# Patient Record
Sex: Female | Born: 1935 | Race: White | Hispanic: No | Marital: Single | Smoking: Never smoker
Health system: Southern US, Community
[De-identification: ages and names within clinical notes are randomized; demographics above are authoritative.]

## PROBLEM LIST (undated history)

## (undated) DIAGNOSIS — E119 Type 2 diabetes mellitus without complications: Secondary | ICD-10-CM

## (undated) DIAGNOSIS — I639 Cerebral infarction, unspecified: Secondary | ICD-10-CM

## (undated) HISTORY — PX: HYSTERECTOMY ABDOMINAL WITH SALPINGECTOMY: SHX6725

---

## 2019-07-31 ENCOUNTER — Encounter (HOSPITAL_COMMUNITY): Payer: Self-pay | Admitting: Emergency Medicine

## 2019-07-31 ENCOUNTER — Other Ambulatory Visit: Payer: Self-pay

## 2019-07-31 ENCOUNTER — Emergency Department (HOSPITAL_COMMUNITY): Payer: Medicare Other | Admitting: Registered Nurse

## 2019-07-31 ENCOUNTER — Encounter (HOSPITAL_COMMUNITY)
Admission: EM | Disposition: A | Discharge status: Skilled Nursing Facility | Payer: Self-pay | Source: Home / Self Care | Attending: Neurology

## 2019-07-31 ENCOUNTER — Emergency Department (HOSPITAL_COMMUNITY): Payer: Medicare Other

## 2019-07-31 ENCOUNTER — Inpatient Hospital Stay (HOSPITAL_COMMUNITY)
Admission: EM | Admit: 2019-07-31 | Discharge: 2019-08-09 | DRG: 023 | Disposition: A | Discharge status: Skilled Nursing Facility | Payer: Medicare Other | Attending: Neurology | Admitting: Neurology

## 2019-07-31 DIAGNOSIS — Z87448 Personal history of other diseases of urinary system: Secondary | ICD-10-CM

## 2019-07-31 DIAGNOSIS — Z20828 Contact with and (suspected) exposure to other viral communicable diseases: Secondary | ICD-10-CM | POA: Diagnosis present

## 2019-07-31 DIAGNOSIS — R52 Pain, unspecified: Secondary | ICD-10-CM

## 2019-07-31 DIAGNOSIS — I616 Nontraumatic intracerebral hemorrhage, multiple localized: Secondary | ICD-10-CM | POA: Diagnosis not present

## 2019-07-31 DIAGNOSIS — R414 Neurologic neglect syndrome: Secondary | ICD-10-CM | POA: Diagnosis present

## 2019-07-31 DIAGNOSIS — J309 Allergic rhinitis, unspecified: Secondary | ICD-10-CM | POA: Diagnosis present

## 2019-07-31 DIAGNOSIS — M1611 Unilateral primary osteoarthritis, right hip: Secondary | ICD-10-CM | POA: Diagnosis present

## 2019-07-31 DIAGNOSIS — I6601 Occlusion and stenosis of right middle cerebral artery: Secondary | ICD-10-CM | POA: Diagnosis present

## 2019-07-31 DIAGNOSIS — D72829 Elevated white blood cell count, unspecified: Secondary | ICD-10-CM | POA: Diagnosis not present

## 2019-07-31 DIAGNOSIS — I6523 Occlusion and stenosis of bilateral carotid arteries: Secondary | ICD-10-CM | POA: Diagnosis present

## 2019-07-31 DIAGNOSIS — E785 Hyperlipidemia, unspecified: Secondary | ICD-10-CM | POA: Diagnosis present

## 2019-07-31 DIAGNOSIS — I611 Nontraumatic intracerebral hemorrhage in hemisphere, cortical: Secondary | ICD-10-CM | POA: Diagnosis present

## 2019-07-31 DIAGNOSIS — I10 Essential (primary) hypertension: Secondary | ICD-10-CM | POA: Diagnosis present

## 2019-07-31 DIAGNOSIS — I4821 Permanent atrial fibrillation: Secondary | ICD-10-CM | POA: Diagnosis not present

## 2019-07-31 DIAGNOSIS — M25551 Pain in right hip: Secondary | ICD-10-CM

## 2019-07-31 DIAGNOSIS — K5900 Constipation, unspecified: Secondary | ICD-10-CM | POA: Diagnosis not present

## 2019-07-31 DIAGNOSIS — E1142 Type 2 diabetes mellitus with diabetic polyneuropathy: Secondary | ICD-10-CM | POA: Diagnosis present

## 2019-07-31 DIAGNOSIS — R471 Dysarthria and anarthria: Secondary | ICD-10-CM | POA: Diagnosis present

## 2019-07-31 DIAGNOSIS — R29810 Facial weakness: Secondary | ICD-10-CM | POA: Diagnosis present

## 2019-07-31 DIAGNOSIS — I700 Atherosclerosis of aorta: Secondary | ICD-10-CM | POA: Diagnosis present

## 2019-07-31 DIAGNOSIS — I639 Cerebral infarction, unspecified: Secondary | ICD-10-CM | POA: Insufficient documentation

## 2019-07-31 DIAGNOSIS — R29719 NIHSS score 19: Secondary | ICD-10-CM | POA: Diagnosis present

## 2019-07-31 DIAGNOSIS — H53462 Homonymous bilateral field defects, left side: Secondary | ICD-10-CM | POA: Diagnosis present

## 2019-07-31 DIAGNOSIS — R509 Fever, unspecified: Secondary | ICD-10-CM | POA: Diagnosis not present

## 2019-07-31 DIAGNOSIS — I480 Paroxysmal atrial fibrillation: Secondary | ICD-10-CM | POA: Diagnosis present

## 2019-07-31 DIAGNOSIS — Z7982 Long term (current) use of aspirin: Secondary | ICD-10-CM

## 2019-07-31 DIAGNOSIS — R4189 Other symptoms and signs involving cognitive functions and awareness: Secondary | ICD-10-CM | POA: Diagnosis present

## 2019-07-31 DIAGNOSIS — E1165 Type 2 diabetes mellitus with hyperglycemia: Secondary | ICD-10-CM | POA: Diagnosis present

## 2019-07-31 DIAGNOSIS — I69391 Dysphagia following cerebral infarction: Secondary | ICD-10-CM

## 2019-07-31 DIAGNOSIS — R531 Weakness: Secondary | ICD-10-CM

## 2019-07-31 DIAGNOSIS — E7800 Pure hypercholesterolemia, unspecified: Secondary | ICD-10-CM | POA: Diagnosis not present

## 2019-07-31 DIAGNOSIS — H401130 Primary open-angle glaucoma, bilateral, stage unspecified: Secondary | ICD-10-CM | POA: Diagnosis present

## 2019-07-31 DIAGNOSIS — R338 Other retention of urine: Secondary | ICD-10-CM | POA: Diagnosis not present

## 2019-07-31 DIAGNOSIS — I4891 Unspecified atrial fibrillation: Secondary | ICD-10-CM | POA: Diagnosis not present

## 2019-07-31 DIAGNOSIS — D62 Acute posthemorrhagic anemia: Secondary | ICD-10-CM | POA: Diagnosis not present

## 2019-07-31 DIAGNOSIS — Z8673 Personal history of transient ischemic attack (TIA), and cerebral infarction without residual deficits: Secondary | ICD-10-CM | POA: Diagnosis not present

## 2019-07-31 DIAGNOSIS — R1310 Dysphagia, unspecified: Secondary | ICD-10-CM | POA: Diagnosis present

## 2019-07-31 DIAGNOSIS — I63411 Cerebral infarction due to embolism of right middle cerebral artery: Principal | ICD-10-CM | POA: Diagnosis present

## 2019-07-31 DIAGNOSIS — Z823 Family history of stroke: Secondary | ICD-10-CM

## 2019-07-31 DIAGNOSIS — E119 Type 2 diabetes mellitus without complications: Secondary | ICD-10-CM

## 2019-07-31 DIAGNOSIS — Z880 Allergy status to penicillin: Secondary | ICD-10-CM

## 2019-07-31 DIAGNOSIS — G8194 Hemiplegia, unspecified affecting left nondominant side: Secondary | ICD-10-CM | POA: Diagnosis present

## 2019-07-31 DIAGNOSIS — M79604 Pain in right leg: Secondary | ICD-10-CM | POA: Diagnosis present

## 2019-07-31 DIAGNOSIS — R339 Retention of urine, unspecified: Secondary | ICD-10-CM | POA: Diagnosis not present

## 2019-07-31 DIAGNOSIS — I6389 Other cerebral infarction: Secondary | ICD-10-CM | POA: Diagnosis not present

## 2019-07-31 DIAGNOSIS — Z833 Family history of diabetes mellitus: Secondary | ICD-10-CM

## 2019-07-31 DIAGNOSIS — Z7902 Long term (current) use of antithrombotics/antiplatelets: Secondary | ICD-10-CM

## 2019-07-31 HISTORY — PX: RADIOLOGY WITH ANESTHESIA: SHX6223

## 2019-07-31 HISTORY — DX: Type 2 diabetes mellitus without complications: E11.9

## 2019-07-31 HISTORY — DX: Cerebral infarction, unspecified: I63.9

## 2019-07-31 LAB — CBC
HCT: 39.3 % (ref 36.0–46.0)
Hemoglobin: 12.6 g/dL (ref 12.0–15.0)
MCH: 30.2 pg (ref 26.0–34.0)
MCHC: 32.1 g/dL (ref 30.0–36.0)
MCV: 94.2 fL (ref 80.0–100.0)
Platelets: 229 10*3/uL (ref 150–400)
RBC: 4.17 MIL/uL (ref 3.87–5.11)
RDW: 12.4 % (ref 11.5–15.5)
WBC: 8.5 10*3/uL (ref 4.0–10.5)
nRBC: 0 % (ref 0.0–0.2)

## 2019-07-31 LAB — COMPREHENSIVE METABOLIC PANEL
ALT: 15 U/L (ref 0–44)
AST: 19 U/L (ref 15–41)
Albumin: 3.7 g/dL (ref 3.5–5.0)
Alkaline Phosphatase: 61 U/L (ref 38–126)
Anion gap: 10 (ref 5–15)
BUN: 24 mg/dL — ABNORMAL HIGH (ref 8–23)
CO2: 21 mmol/L — ABNORMAL LOW (ref 22–32)
Calcium: 8.9 mg/dL (ref 8.9–10.3)
Chloride: 103 mmol/L (ref 98–111)
Creatinine, Ser: 1.03 mg/dL — ABNORMAL HIGH (ref 0.44–1.00)
GFR calc Af Amer: 55 mL/min — ABNORMAL LOW (ref >60)
GFR calc non Af Amer: 47 mL/min — ABNORMAL LOW (ref >60)
Glucose, Bld: 292 mg/dL — ABNORMAL HIGH (ref 70–99)
Potassium: 4.4 mmol/L (ref 3.5–5.1)
Sodium: 134 mmol/L — ABNORMAL LOW (ref 135–145)
Total Bilirubin: 1.3 mg/dL — ABNORMAL HIGH (ref 0.3–1.2)
Total Protein: 6.6 g/dL (ref 6.5–8.1)

## 2019-07-31 LAB — PROTIME-INR
INR: 1 (ref 0.8–1.2)
Prothrombin Time: 13.1 seconds (ref 11.4–15.2)

## 2019-07-31 LAB — I-STAT CHEM 8, ED
BUN: 23 mg/dL (ref 8–23)
Calcium, Ion: 1.11 mmol/L — ABNORMAL LOW (ref 1.15–1.40)
Chloride: 104 mmol/L (ref 98–111)
Creatinine, Ser: 0.9 mg/dL (ref 0.44–1.00)
Glucose, Bld: 282 mg/dL — ABNORMAL HIGH (ref 70–99)
HCT: 39 % (ref 36.0–46.0)
Hemoglobin: 13.3 g/dL (ref 12.0–15.0)
Potassium: 4.4 mmol/L (ref 3.5–5.1)
Sodium: 137 mmol/L (ref 135–145)
TCO2: 23 mmol/L (ref 22–32)

## 2019-07-31 LAB — URINALYSIS, ROUTINE W REFLEX MICROSCOPIC
Bacteria, UA: NONE SEEN
Bilirubin Urine: NEGATIVE
Glucose, UA: >=500 mg/dL — AB
Hgb urine dipstick: NEGATIVE
Ketones, ur: 20 mg/dL — AB
Leukocytes,Ua: NEGATIVE
Nitrite: NEGATIVE
Protein, ur: NEGATIVE mg/dL
Specific Gravity, Urine: 1.024 (ref 1.005–1.030)
pH: 6 (ref 5.0–8.0)

## 2019-07-31 LAB — DIFFERENTIAL
Abs Immature Granulocytes: 0.02 10*3/uL (ref 0.00–0.07)
Basophils Absolute: 0 10*3/uL (ref 0.0–0.1)
Basophils Relative: 0 %
Eosinophils Absolute: 0 10*3/uL (ref 0.0–0.5)
Eosinophils Relative: 0 %
Immature Granulocytes: 0 %
Lymphocytes Relative: 6 %
Lymphs Abs: 0.5 10*3/uL — ABNORMAL LOW (ref 0.7–4.0)
Monocytes Absolute: 0.2 10*3/uL (ref 0.1–1.0)
Monocytes Relative: 3 %
Neutro Abs: 7.8 10*3/uL — ABNORMAL HIGH (ref 1.7–7.7)
Neutrophils Relative %: 91 %

## 2019-07-31 LAB — RAPID URINE DRUG SCREEN, HOSP PERFORMED
Amphetamines: NOT DETECTED
Barbiturates: NOT DETECTED
Benzodiazepines: NOT DETECTED
Cocaine: NOT DETECTED
Opiates: NOT DETECTED
Tetrahydrocannabinol: NOT DETECTED

## 2019-07-31 LAB — APTT: aPTT: 25 seconds (ref 24–36)

## 2019-07-31 LAB — RESPIRATORY PANEL BY RT PCR (FLU A&B, COVID)
Influenza A by PCR: NEGATIVE
Influenza B by PCR: NEGATIVE
SARS Coronavirus 2 by RT PCR: NEGATIVE

## 2019-07-31 LAB — CBG MONITORING, ED: Glucose-Capillary: 268 mg/dL — ABNORMAL HIGH (ref 70–99)

## 2019-07-31 SURGERY — IR WITH ANESTHESIA
Anesthesia: General

## 2019-07-31 MED ORDER — ATORVASTATIN CALCIUM 10 MG PO TABS
20.0000 mg | ORAL_TABLET | Freq: Every day | ORAL | Status: DC
  Administered 2019-08-01 – 2019-08-09 (×8): 20 mg via ORAL
  Filled 2019-07-31 (×8): qty 2

## 2019-07-31 MED ORDER — EPHEDRINE SULFATE-NACL 50-0.9 MG/10ML-% IV SOSY
PREFILLED_SYRINGE | INTRAVENOUS | Status: DC | PRN
  Administered 2019-07-31: 2.5 mg via INTRAVENOUS

## 2019-07-31 MED ORDER — LATANOPROST 0.005 % OP SOLN
1.0000 [drp] | Freq: Every day | OPHTHALMIC | Status: DC
  Administered 2019-08-01 – 2019-08-08 (×9): 1 [drp] via OPHTHALMIC
  Filled 2019-07-31: qty 2.5

## 2019-07-31 MED ORDER — FENTANYL CITRATE (PF) 250 MCG/5ML IJ SOLN
INTRAMUSCULAR | Status: DC | PRN
  Administered 2019-07-31 (×2): 25 ug via INTRAVENOUS

## 2019-07-31 MED ORDER — ROCURONIUM BROMIDE 10 MG/ML (PF) SYRINGE
PREFILLED_SYRINGE | INTRAVENOUS | Status: DC | PRN
  Administered 2019-07-31: 40 mg via INTRAVENOUS

## 2019-07-31 MED ORDER — FENTANYL CITRATE (PF) 100 MCG/2ML IJ SOLN
INTRAMUSCULAR | Status: AC
  Filled 2019-07-31: qty 2

## 2019-07-31 MED ORDER — PHENYLEPHRINE 40 MCG/ML (10ML) SYRINGE FOR IV PUSH (FOR BLOOD PRESSURE SUPPORT)
PREFILLED_SYRINGE | INTRAVENOUS | Status: DC | PRN
  Administered 2019-07-31 (×3): 80 ug via INTRAVENOUS
  Administered 2019-07-31: 40 ug via INTRAVENOUS

## 2019-07-31 MED ORDER — SUCCINYLCHOLINE CHLORIDE 200 MG/10ML IV SOSY
PREFILLED_SYRINGE | INTRAVENOUS | Status: DC | PRN
  Administered 2019-07-31: 90 mg via INTRAVENOUS

## 2019-07-31 MED ORDER — SENNOSIDES-DOCUSATE SODIUM 8.6-50 MG PO TABS
1.0000 | ORAL_TABLET | Freq: Every evening | ORAL | Status: DC | PRN
  Administered 2019-08-03: 1 via ORAL
  Filled 2019-07-31: qty 1

## 2019-07-31 MED ORDER — ACETAMINOPHEN 325 MG PO TABS: 650.0000 mg | ORAL_TABLET | ORAL | Status: DC | PRN

## 2019-07-31 MED ORDER — LACTATED RINGERS IV SOLN: INTRAVENOUS | Status: DC | PRN

## 2019-07-31 MED ORDER — ACETAMINOPHEN 650 MG RE SUPP: 650.0000 mg | RECTAL | Status: DC | PRN

## 2019-07-31 MED ORDER — INSULIN ASPART 100 UNIT/ML ~~LOC~~ SOLN
0.0000 [IU] | SUBCUTANEOUS | Status: DC
  Administered 2019-08-01: 3 [IU] via SUBCUTANEOUS
  Administered 2019-08-01: 2 [IU] via SUBCUTANEOUS
  Administered 2019-08-01: 1 [IU] via SUBCUTANEOUS
  Administered 2019-08-01: 5 [IU] via SUBCUTANEOUS
  Administered 2019-08-02: 3 [IU] via SUBCUTANEOUS
  Administered 2019-08-02: 1 [IU] via SUBCUTANEOUS
  Administered 2019-08-02: 2 [IU] via SUBCUTANEOUS
  Administered 2019-08-02: 1 [IU] via SUBCUTANEOUS
  Administered 2019-08-02: 3 [IU] via SUBCUTANEOUS
  Administered 2019-08-03: 2 [IU] via SUBCUTANEOUS
  Administered 2019-08-03: 5 [IU] via SUBCUTANEOUS
  Administered 2019-08-03 (×2): 2 [IU] via SUBCUTANEOUS
  Administered 2019-08-04: 1 [IU] via SUBCUTANEOUS

## 2019-07-31 MED ORDER — CEFAZOLIN SODIUM-DEXTROSE 2-4 GM/100ML-% IV SOLN
INTRAVENOUS | Status: AC
  Filled 2019-07-31: qty 100

## 2019-07-31 MED ORDER — NITROGLYCERIN 1 MG/10 ML FOR IR/CATH LAB
INTRA_ARTERIAL | Status: AC | PRN
  Administered 2019-07-31 (×2): 25 ug via INTRA_ARTERIAL

## 2019-07-31 MED ORDER — PROPOFOL 10 MG/ML IV BOLUS
INTRAVENOUS | Status: DC | PRN
  Administered 2019-07-31: 100 mg via INTRAVENOUS

## 2019-07-31 MED ORDER — AMIODARONE HCL 200 MG PO TABS
200.0000 mg | ORAL_TABLET | Freq: Every day | ORAL | Status: DC
  Administered 2019-08-01 – 2019-08-09 (×9): 200 mg via ORAL
  Filled 2019-07-31 (×9): qty 1

## 2019-07-31 MED ORDER — SODIUM CHLORIDE 0.9 % IV SOLN: INTRAVENOUS | Status: DC

## 2019-07-31 MED ORDER — LOSARTAN POTASSIUM 50 MG PO TABS
25.0000 mg | ORAL_TABLET | Freq: Every day | ORAL | Status: DC
  Administered 2019-08-01 – 2019-08-09 (×9): 25 mg via ORAL
  Filled 2019-07-31 (×9): qty 1

## 2019-07-31 MED ORDER — PHENYLEPHRINE HCL-NACL 10-0.9 MG/250ML-% IV SOLN
INTRAVENOUS | Status: DC | PRN
  Administered 2019-07-31: 10 ug/min via INTRAVENOUS

## 2019-07-31 MED ORDER — NITROGLYCERIN 1 MG/10 ML FOR IR/CATH LAB
INTRA_ARTERIAL | Status: AC
  Filled 2019-07-31: qty 10

## 2019-07-31 MED ORDER — STROKE: EARLY STAGES OF RECOVERY BOOK
Freq: Once | Status: DC
  Filled 2019-07-31: qty 1

## 2019-07-31 MED ORDER — IOHEXOL 350 MG/ML SOLN
100.0000 mL | Freq: Once | INTRAVENOUS | Status: AC | PRN
  Administered 2019-07-31: 100 mL via INTRAVENOUS

## 2019-07-31 MED ORDER — ACETAMINOPHEN 160 MG/5ML PO SOLN: 650.0000 mg | ORAL | Status: DC | PRN

## 2019-07-31 MED ORDER — CEFAZOLIN SODIUM-DEXTROSE 2-3 GM-%(50ML) IV SOLR
INTRAVENOUS | Status: DC | PRN
  Administered 2019-07-31: 2 g via INTRAVENOUS

## 2019-07-31 NOTE — Anesthesia Procedure Notes (Signed)
Procedure Name: Intubation Date/Time: 07/31/2019 10:59 PM Performed by: Jearld Pies, CRNA Pre-anesthesia Checklist: Patient identified, Emergency Drugs available, Suction available and Patient being monitored Patient Re-evaluated:Patient Re-evaluated prior to induction Oxygen Delivery Method: Circle System Utilized Preoxygenation: Pre-oxygenation with 100% oxygen Induction Type: IV induction and Rapid sequence Laryngoscope Size: Mac and 3 Grade View: Grade I Tube type: Oral Tube size: 7.0 mm Number of attempts: 1 Airway Equipment and Method: Stylet Placement Confirmation: ETT inserted through vocal cords under direct vision,  breath sounds checked- equal and bilateral and CO2 detector Secured at: 22 cm Tube secured with: Tape Dental Injury: Teeth and Oropharynx as per pre-operative assessment  Comments: Glidescope utilized d/t COVID19 precautions.

## 2019-07-31 NOTE — ED Notes (Signed)
[place] and updates.

## 2019-07-31 NOTE — H&P (Signed)
Referring Physician: Dr. Johnney Killian    Chief Complaint: Acute onset of left sided weakness.   HPI: Veronica Holland is an 85 y.o. female with recent history of stroke x 1 for which she received tPA, who was brought in by EMS after daughter called 911 upon finding her mother sitting in a chair at home densely weak on the left side with an apparent unawareness of the deficit. Apparently had just eaten and had been doing a crossword puzzle - this along with her glasses was found on the floor. She was found with the deficits at 8:30 PM, having been LKN at 1200 per neighbor who had stopped by then. She had a stroke in September from which she completely recovered, per daughter. Per interview with daughter, the patient's mRS is 0.   EMS noted left facial droop, left hemiplegia, rightward eye deviation and slurred speech on arrival to the patient's home. BP 140/74, HR 84 and irregular, CBG 325 en route.   She takes ASA and Plavix at home. Per report she is on insulin. Also takes losartan and atorvastatin.   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 BREAST EXCISIONAL BIOPSY Right 1981  bg  . BREAST SURGERY  . catheter ablation atrial fibrillation  . EYE SURGERY  . HYSTERECTOMY  . TONSILLECTOMY   Allergies: Bee venom Penicillin  SHx Not a tobacco user.   FHx . Diabetes Father  . Stroke Father    Home medications: Current Outpatient Medications:  . aspirin 81 MG EC tablet *ANTIPLATELET*, Take 1 tablet (81 mg total) by mouth daily., Disp: 90 tablet, Rfl: 5 . atorvastatin (LIPITOR) 20  MG tablet, Take 1 tablet (20 mg total) by mouth daily., Disp: 90 tablet, Rfl: 3 . CALCIUM CARBONATE/VITAMIN D3 (CALCIUM 600 WITH VITAMIN D3 ORAL), Take 1 tablet by mouth daily. , Disp: , Rfl:  . clopidogreL (PLAVIX) 75 mg tablet *ANTIPLATELET*, Take 1 tablet (75 mg total) by mouth daily., Disp: 90 tablet, Rfl: 3 . insulin aspart U-100 (NOVOLOG FLEXPEN U-100 INSULIN) 100 unit/mL (3 mL) injection, INJECT UP TO 16 UNITS UNDER THE SKIN THREE TIMES DAILY MDD 50 UNITS, Disp: 45 Syringe, Rfl: 3 . insulin detemir (LEVEMIR U-100 INSULIN) 100 unit/mL injection, Inject 25 Units into the skin nightly., Disp: , Rfl:  . insulin syringe-needle U-100 (BD INSULIN SYRINGE ULTRA-FINE) 0.3 mL 31 gauge x 15/64" Syrg, Use to inject insulin three times daily, Disp: 100 Syringe, Rfl: 5 . latanoprost (XALATAN) 0.005 % ophthalmic solution, Place 1 drop into both eyes nightly., Disp: , Rfl:  . losartan (COZAAR) 25 MG tablet, TAKE 1 TABLET BY MOUTH EVERY DAY, Disp: 90 tablet, Rfl: 3 . multivitamin with iron (ONE DAILY MULTI-VIT W-MINERAL) Tab, Take 1 tablet by mouth daily. , Disp: , Rfl:  . ONETOUCH ULTRA BLUE TEST STRIP Strp test strips, USE TO CHECK BLOOD SUGAR 3 TIMES A DAY. DX: E11.42, Disp: 300 strip, Rfl: 2 . pen needle, diabetic (BD ULTRA-FINE SHORT PEN NEEDLE) 31 gauge x 5/16" Ndle, Use to inject insulin 4 times daily as directed., Disp: 200 each, Rfl: 3 . amiodarone (PACERONE) 200 MG tablet, Take 1 tablet (200 mg total) by mouth daily.,  Disp: 30 tablet, Rfl: 11    ROS: As per HPI. Denies any additional symptoms. Unable to perform comprehensive ROS due to acuity of presentation and left hemineglect/anosognosia. She has no perception of her acute left sided deficit.   Physical Examination: Weight 68 kg.  Neurologic Examination: Mental Status: Alert with left hemineglect.  Speech with intermittent dysfluency as well as dysarthria. Mild comprehension deficit. Able to follow all simple motor commands.  Cranial  Nerves: II:  Left visual field cut. PERRL.  III,IV, VI: No ptosis. Rightward gaze deviation. Does not cross midline to left.  V,VII: Left facial droop. Facial temp sensation intact but slower to respond on the left.  VIII: hearing intact to questions and commands.  IX,X: Pharyngeal dysarthria noted.  XI: Shoulder shrug decreased on the left XII: Leftward tongue deviation Motor: RUE and RLE 5/5 LUE with increased tone, 0/5 proximally and distally LLE unable to elevate antigravity, drops to bed after passive elevation without any effort against gravity.  Sensory: Decreased responses to temp and FT on the left. Positive for extinction on the left.  Deep Tendon Reflexes:  1+ right brachioradialis and biceps. 3+ left brachioradialis and biceps 1+ right patella 2+ left patella 1+ right achilles 0 left achilles Right toe downgoing, left toe upgoing.  Cerebellar: No gross ataxia to RUE Gait: Unable to assess  Results for orders placed or performed during the hospital encounter of 07/31/19 (from the past 48 hour(s))  I-stat chem 8, ED     Status: Abnormal   Collection Time: 07/31/19  9:33 PM  Result Value Ref Range   Sodium 137 135 - 145 mmol/L   Potassium 4.4 3.5 - 5.1 mmol/L   Chloride 104 98 - 111 mmol/L   BUN 23 8 - 23 mg/dL   Creatinine, Ser 1.610.90 0.44 - 1.00 mg/dL   Glucose, Bld 096282 (H) 70 - 99 mg/dL   Calcium, Ion 0.451.11 (L) 1.15 - 1.40 mmol/L   TCO2 23 22 - 32 mmol/L   Hemoglobin 13.3 12.0 - 15.0 g/dL   HCT 40.939.0 81.136.0 - 91.446.0 %   No results found.  Assessment: 85 y.o. female presenting with acute onset of left hemiplegia, left hemineglect, left facial droop, left hemianopsia, dysarthria and anosognosia.  1. NIHSS 19 2. CT head: Hyperdense right MCA compatible with acute thrombus. Probable early infarct right lateral basal ganglia. ASPECTS is 9 3. 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 There is  collateral circulation with decreased perfusion in the right MCA territory.  4. CTA neck: 50% diameter stenosis proximal left subclavian artery. Right carotid atherosclerotic disease without significant stenosis. 50% diameter stenosis proximal left internal carotid artery. 5. CT perfusion demonstrates 8 mL of core infarct in the right basal ganglia. 95 mL of penumbra in the right temporoparietal lobe. 6. Stroke Risk Factors - Paroxysmal atrial fibrillation, DM2, HTN, prior stroke and HLD   Plan: 1. The patient is not a tPA candidate due to time criteria 2. The patient is a candidate for VIR. Discussed risks/benefits with patient followed by daughter together with Dr. Corliss Skainseveshwar over the telephone. All questions answered. Consent form signed.  3. Admitting to Neuro ICU after VIR. 4. Post-VIR order set to include frequent neuro checks and BP management.  5. No antiplatelet medications or anticoagulants for at least 24 hours following tPA.  6. DVT prophylaxis with SCDs.  7. Continue atorvastatin.  8. Of note, patient has refused anticoagulation for  her atrial fibrillation in the past after being informed of the risks and benefits.  9. TTE if she did not have one at prior admit to OSH for stroke previously this Autumn. Otherwise, consider TEE.  10. MRI brain 11. PT/OT/Speech.  12. NPO until passes swallow evaluation.  13. Sliding scale insulin.  14. Telemetry monitoring 15. Fasting lipid panel, HgbA1c   85 minutes spent in the emergent neurological evaluation and management of this critically ill patient  @Electronically  signed: Dr.  07/31/2019, 9:36 PM

## 2019-07-31 NOTE — Sedation Documentation (Signed)
Spoke with [facility] in pt transport. Requested bed be brought to IR2.

## 2019-07-31 NOTE — ED Provider Notes (Signed)
[facility] EMERGENCY DEPARTMENT Provider Note   CSN: [id number] Arrival date & time: 07/31/19  2126     History Chief Complaint  Patient presents with   Code Stroke    Veronica Holland is a 85 y.o. female.  HPI Patient was last seen well at noon by her neighbor.  It 8:30 PM her daughter came and found her seated with a glass on the floor and dense left-sided weakness.  Patient had already had a stroke in September for when she had completely recovered.  Patient endorses a posterior headache.    Past Medical History:  Diagnosis Date   Diabetes mellitus without complication ([place])    Stroke ([place])     There are no problems to display for this patient.   Past Surgical History:  Procedure Laterality Date   HYSTERECTOMY ABDOMINAL WITH SALPINGECTOMY       OB History   No obstetric history on file.     History reviewed. No pertinent family history.  Social History   Tobacco Use   Smoking status: Never Smoker   Smokeless tobacco: Never Used  Substance Use Topics   Alcohol use: Not Currently   Drug use: Never    Home Medications Prior to Admission medications   Not on File    Allergies    Patient has no allergy information on record.  Review of Systems   Review of Systems Level 5 caveat cannot obtain review of systems due to patient condition. Physical Exam Updated Vital Signs BP 134/62    Pulse 92    Temp (!) 97.4 F (36.3 C) (Oral)    Resp (!) 23    Ht 5\' 4"  (1.626 m)    Wt 68 kg    SpO2 99%    BMI 25.73 kg/m   Physical Exam Constitutional:      Comments: Patient is nontoxic well-nourished well-developed.  She does have a gaze deviation and appears slightly somnolent.  No respiratory distress.  HENT:     Head: Normocephalic and atraumatic.     Mouth/Throat:     Comments: Airway clear. Cardiovascular:     Rate and Rhythm: Normal rate and regular rhythm.  Pulmonary:     Effort: Pulmonary effort is normal.     Breath sounds:  Normal breath sounds.  Abdominal:     General: There is no distension.     Palpations: Abdomen is soft.     Tenderness: There is no abdominal tenderness.  Skin:    General: Skin is warm and dry.  Neurological:     Comments: Patient is answering questions.  She is somnolent.  She does have left facial droop and right gaze deviation.  She is answering simple questions.  Patient will follow commands to do a grip strength testing on the right hand and move the right lower extremity.  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     ED Results / Procedures / Treatments   Labs (all labs ordered are listed, but only abnormal results are displayed) Labs Reviewed  DIFFERENTIAL - Abnormal; Notable for the following components:      Result Value   Neutro Abs 7.8 (*)    Lymphs Abs 0.5 (*)    All other components within normal limits  COMPREHENSIVE METABOLIC PANEL - Abnormal; Notable for the following components:   Sodium 134 (*)    CO2 21 (*)    Glucose, Bld 292 (*)    BUN 24 (*)  Creatinine, Ser 1.03 (*)    Total Bilirubin 1.3 (*)    GFR calc non Af Amer 47 (*)    GFR calc Af Amer 55 (*)    All other components within normal limits  I-STAT CHEM 8, ED - Abnormal; Notable for the following components:   Glucose, Bld 282 (*)    Calcium, Ion 1.11 (*)    All other components within normal limits  CBG MONITORING, ED - Abnormal; Notable for the following components:   Glucose-Capillary 268 (*)    All other components within normal limits  RESPIRATORY PANEL BY RT PCR (FLU A&B, COVID)  PROTIME-INR  APTT  CBC  ETHANOL  RAPID URINE DRUG SCREEN, HOSP PERFORMED  URINALYSIS, ROUTINE W REFLEX MICROSCOPIC    EKG EKG Interpretation  Date/Time:  Tuesday July 31 2019 22:06:58 EST Ventricular Rate:  88 PR Interval:    QRS Duration: 93 QT Interval:  395 QTC Calculation: 478 R Axis:   30 Text Interpretation: Sinus rhythm Consider left atrial enlargement Anterior  infarct, old Nonspecific repol abnormality, diffuse leads No old tracing to compare Confirmed by Pricilla Loveless [phone]) on 08/01/2019 9:22:43 AM   Radiology CT Code Stroke CTA Head W/WO contrast  Result Date: 07/31/2019 CLINICAL DATA:  Stroke.  Left-sided weakness EXAM: CT ANGIOGRAPHY HEAD AND NECK CT PERFUSION BRAIN TECHNIQUE: Multidetector CT imaging of the head and neck was performed using the standard protocol during bolus administration of intravenous contrast. Multiplanar CT image reconstructions and MIPs were obtained to evaluate the vascular anatomy. Carotid stenosis measurements (when applicable) are obtained utilizing NASCET criteria, using the distal internal carotid diameter as the denominator. Multiphase CT imaging of the brain was performed following IV bolus contrast injection. Subsequent parametric perfusion maps were calculated using RAPID software. CONTRAST:  OMNIPAQUE IOHEXOL 350 MG/ML SOLN 100 mL Isovue 370 IV COMPARISON:  CT head 07/31/2019 FINDINGS: Aortic arch: Standard branching. Imaged portion shows no evidence of aneurysm or dissection. No significant stenosis of the major arch vessel origins. Atherosclerotic disease in the aortic arch. Atherosclerotic plaque proximal left subclavian artery with approximately 50% diameter stenosis proximal to the vertebral artery. Right carotid system: Atherosclerotic calcification at the right carotid bifurcation without significant stenosis. Left carotid system: Atherosclerotic calcification left carotid bifurcation. Approximately 50% diameter stenosis proximal left internal carotid artery. Vertebral arteries: Right vertebral artery dominant. Both vertebral arteries patent to the basilar. Mild calcific stenosis at the origin of the vertebral bilaterally. Skeleton: Cervical spondylosis.  No acute skeletal abnormality. Other neck: 7 x 12 mm right thyroid nodule. No adenopathy in the neck. Upper chest: Lung apices clear bilaterally. Review of  the MIP images confirms the above findings CTA HEAD FINDINGS Anterior circulation: Atherosclerotic calcification right cavernous carotid which is patent without significant stenosis. Thrombus in the terminal right internal carotid artery extending into the right M1 and proximal right A1 segments. There is flow distal to the right M1 occlusion with decreased enhancement of right MCA branches. Right anterior cerebral artery patent supplied from the left Left cavernous carotid without significant stenosis. Diffuse atherosclerotic calcification. Left anterior and middle cerebral arteries patent bilaterally without stenosis. Posterior circulation: Both vertebral arteries patent to the basilar. PICA patent bilaterally. Basilar widely patent. Superior cerebellar and posterior cerebral arteries patent bilaterally without significant stenosis or occlusion. Venous sinuses: Limited venous contrast due to our field of arterial phase scanning. Anatomic variants: None CT Brain Perfusion Findings: ASPECTS: 9 CBF (<30%) Volume: 67mL Perfusion (Tmax>6.0s) volume: Mismatch Volume: 28mL Infarction Location:Core infarct right  basal ganglia. Penumbra in the right temporal and parietal lobe in the right MCA territory. IMPRESSION: 1. 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 There is collateral circulation with decreased perfusion in the right MCA territory. 2. CT perfusion demonstrates 8 mL of core infarct in the right basal ganglia. 95 mL of penumbra in the right temporoparietal lobe. 3. 50% diameter stenosis proximal left subclavian artery. 4. Right carotid atherosclerotic disease without significant stenosis. 50% diameter stenosis proximal left internal carotid artery. 5. These results were called by telephone at the time of interpretation on 07/31/2019 at 10:11 pm to provider ERIC [facility] , who verbally acknowledged these results. Electronically Signed   By:  Marlan Palau M.D.   On: 07/31/2019 22:12   CT Code Stroke CTA Neck W/WO contrast  Result Date: 07/31/2019 CLINICAL DATA:  Stroke.  Left-sided weakness EXAM: CT ANGIOGRAPHY HEAD AND NECK CT PERFUSION BRAIN TECHNIQUE: Multidetector CT imaging of the head and neck was performed using the standard protocol during bolus administration of intravenous contrast. Multiplanar CT image reconstructions and MIPs were obtained to evaluate the vascular anatomy. Carotid stenosis measurements (when applicable) are obtained utilizing NASCET criteria, using the distal internal carotid diameter as the denominator. Multiphase CT imaging of the brain was performed following IV bolus contrast injection. Subsequent parametric perfusion maps were calculated using RAPID software. CONTRAST:  OMNIPAQUE IOHEXOL 350 MG/ML SOLN 100 mL Isovue 370 IV COMPARISON:  CT head 07/31/2019 FINDINGS: Aortic arch: Standard branching. Imaged portion shows no evidence of aneurysm or dissection. No significant stenosis of the major arch vessel origins. Atherosclerotic disease in the aortic arch. Atherosclerotic plaque proximal left subclavian artery with approximately 50% diameter stenosis proximal to the vertebral artery. Right carotid system: Atherosclerotic calcification at the right carotid bifurcation without significant stenosis. Left carotid system: Atherosclerotic calcification left carotid bifurcation. Approximately 50% diameter stenosis proximal left internal carotid artery. Vertebral arteries: Right vertebral artery dominant. Both vertebral arteries patent to the basilar. Mild calcific stenosis at the origin of the vertebral bilaterally. Skeleton: Cervical spondylosis.  No acute skeletal abnormality. Other neck: 7 x 12 mm right thyroid nodule. No adenopathy in the neck. Upper chest: Lung apices clear bilaterally. Review of the MIP images confirms the above findings CTA HEAD FINDINGS Anterior circulation: Atherosclerotic calcification  right cavernous carotid which is patent without significant stenosis. Thrombus in the terminal right internal carotid artery extending into the right M1 and proximal right A1 segments. There is flow distal to the right M1 occlusion with decreased enhancement of right MCA branches. Right anterior cerebral artery patent supplied from the left Left cavernous carotid without significant stenosis. Diffuse atherosclerotic calcification. Left anterior and middle cerebral arteries patent bilaterally without stenosis. Posterior circulation: Both vertebral arteries patent to the basilar. PICA patent bilaterally. Basilar widely patent. Superior cerebellar and posterior cerebral arteries patent bilaterally without significant stenosis or occlusion. Venous sinuses: Limited venous contrast due to our field of arterial phase scanning. Anatomic variants: None CT Brain Perfusion Findings: ASPECTS: 9 CBF (<30%) Volume: 8mL Perfusion (Tmax>6.0s) volume: Mismatch Volume: 95mL Infarction Location:Core infarct right basal ganglia. Penumbra in the right temporal and parietal lobe in the right MCA territory. IMPRESSION: 1. 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 There is collateral circulation with decreased perfusion in the right MCA territory. 2. CT perfusion demonstrates 8 mL of core infarct in the right basal ganglia. 95 mL of penumbra  in the right temporoparietal lobe. 3. 50% diameter stenosis proximal left subclavian artery. 4. Right carotid atherosclerotic disease without significant stenosis. 50% diameter stenosis proximal left internal carotid artery. 5. These results were called by telephone at the time of interpretation on 07/31/2019 at 10:11 pm to provider ERIC [facility] , who verbally acknowledged these results. Electronically Signed   By: Marlan Palau M.D.   On: 07/31/2019 22:12   CT Code Stroke Cerebral Perfusion with contrast  Result Date:  07/31/2019 CLINICAL DATA:  Stroke.  Left-sided weakness EXAM: CT ANGIOGRAPHY HEAD AND NECK CT PERFUSION BRAIN TECHNIQUE: Multidetector CT imaging of the head and neck was performed using the standard protocol during bolus administration of intravenous contrast. Multiplanar CT image reconstructions and MIPs were obtained to evaluate the vascular anatomy. Carotid stenosis measurements (when applicable) are obtained utilizing NASCET criteria, using the distal internal carotid diameter as the denominator. Multiphase CT imaging of the brain was performed following IV bolus contrast injection. Subsequent parametric perfusion maps were calculated using RAPID software. CONTRAST:  OMNIPAQUE IOHEXOL 350 MG/ML SOLN 100 mL Isovue 370 IV COMPARISON:  CT head 07/31/2019 FINDINGS: Aortic arch: Standard branching. Imaged portion shows no evidence of aneurysm or dissection. No significant stenosis of the major arch vessel origins. Atherosclerotic disease in the aortic arch. Atherosclerotic plaque proximal left subclavian artery with approximately 50% diameter stenosis proximal to the vertebral artery. Right carotid system: Atherosclerotic calcification at the right carotid bifurcation without significant stenosis. Left carotid system: Atherosclerotic calcification left carotid bifurcation. Approximately 50% diameter stenosis proximal left internal carotid artery. Vertebral arteries: Right vertebral artery dominant. Both vertebral arteries patent to the basilar. Mild calcific stenosis at the origin of the vertebral bilaterally. Skeleton: Cervical spondylosis.  No acute skeletal abnormality. Other neck: 7 x 12 mm right thyroid nodule. No adenopathy in the neck. Upper chest: Lung apices clear bilaterally. Review of the MIP images confirms the above findings CTA HEAD FINDINGS Anterior circulation: Atherosclerotic calcification right cavernous carotid which is patent without significant stenosis. Thrombus in the terminal right  internal carotid artery extending into the right M1 and proximal right A1 segments. There is flow distal to the right M1 occlusion with decreased enhancement of right MCA branches. Right anterior cerebral artery patent supplied from the left Left cavernous carotid without significant stenosis. Diffuse atherosclerotic calcification. Left anterior and middle cerebral arteries patent bilaterally without stenosis. Posterior circulation: Both vertebral arteries patent to the basilar. PICA patent bilaterally. Basilar widely patent. Superior cerebellar and posterior cerebral arteries patent bilaterally without significant stenosis or occlusion. Venous sinuses: Limited venous contrast due to our field of arterial phase scanning. Anatomic variants: None CT Brain Perfusion Findings: ASPECTS: 9 CBF (<30%) Volume: 8mL Perfusion (Tmax>6.0s) volume: Mismatch Volume: 95mL Infarction Location:Core infarct right basal ganglia. Penumbra in the right temporal and parietal lobe in the right MCA territory. IMPRESSION: 1. 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 There is collateral circulation with decreased perfusion in the right MCA territory. 2. CT perfusion demonstrates 8 mL of core infarct in the right basal ganglia. 95 mL of penumbra in the right temporoparietal lobe. 3. 50% diameter stenosis proximal left subclavian artery. 4. Right carotid atherosclerotic disease without significant stenosis. 50% diameter stenosis proximal left internal carotid artery. 5. These results were called by telephone at the time of interpretation on 07/31/2019 at 10:11 pm to provider ERIC [facility] , who verbally acknowledged these results. Electronically Signed   By: Marlan Palau  M.D.   On: 07/31/2019 22:12   CT HEAD CODE STROKE WO CONTRAST  Result Date: 07/31/2019 CLINICAL DATA:  Code stroke.  Left-sided weakness. Slurred speech. EXAM: CT HEAD WITHOUT CONTRAST TECHNIQUE:  Contiguous axial images were obtained from the base of the skull through the vertex without intravenous contrast. COMPARISON:  CT head 05/18/2027 FINDINGS: Brain: Mild atrophy. Patchy white matter hypodensity bilaterally is unchanged. Mild asymmetric hypodensity right lateral basal ganglia. Insular cortex normal. Negative for hemorrhage. Vascular: Hyperdense right MCA. Skull: Negative Sinuses/Orbits: Paranasal sinuses clear.  Bilateral cataract surgery Other: None ASPECTS ([place] Stroke Program Early CT Score) - Ganglionic level infarction (caudate, lentiform nuclei, internal capsule, insula, M1-M3 cortex): 6 - Supraganglionic infarction (M4-M6 cortex): 3 Total score (0-10 with 10 being normal): 9 IMPRESSION: 1. Hyperdense right MCA compatible with acute thrombus. Probable early infarct right lateral basal ganglia 2. ASPECTS is 9 3. These results were called by telephone at the time of interpretation on 07/31/2019 at 9:41 pm to provider Dr.Lindzen , who verbally acknowledged these results. Electronically Signed   By: Marlan Palauharles  Clark M.D.   On: 07/31/2019 21:42    Procedures Procedures (including critical care time) No critical care time Medications Ordered in ED Medications  iohexol (OMNIPAQUE) 350 MG/ML injection 100 mL (100 mLs Intravenous Contrast Given 07/31/19 2149)    ED Course  I have reviewed the triage vital signs and the nursing notes.  Pertinent labs & imaging results that were available during my care of the patient were reviewed by me and considered in my medical decision making (see chart for details).    MDM Rules/Calculators/A&P                      Patient presents as outlined with severe left paralysis and neglect.  Airway is patent.  Patient can respond and follows simple commands.  Neurology has determined she is a candidate for interventional radiology management.  Will proceed for treatment.  No further emergency department management required by myself. Final Clinical  Impression(s) / ED Diagnoses Final diagnoses:  Stroke [facility](HCC)    Rx / DC Orders ED Discharge Orders    None       Arby BarrettePfeiffer, Chenita Ruda, MD 08/01/19 2016

## 2019-07-31 NOTE — Anesthesia Preprocedure Evaluation (Signed)
Anesthesia Evaluation  Patient identified by MRN, date of birth, ID band Patient confused    Reviewed: Allergy & Precautions, NPO status , Patient's Chart, lab work & pertinent test results, Unable to perform ROS - Chart review onlyPreop documentation limited or incomplete due to emergent nature of procedure.  Airway Mallampati: II  TM Distance: >3 FB Neck ROM: Limited    Dental no notable dental hx.    Pulmonary neg pulmonary ROS,    Pulmonary exam normal breath sounds clear to auscultation       Cardiovascular negative cardio ROS Normal cardiovascular exam Rhythm:Regular Rate:Normal     Neuro/Psych CVA negative psych ROS   GI/Hepatic negative GI ROS, Neg liver ROS,   Endo/Other  diabetes  Renal/GU negative Renal ROS  negative genitourinary   Musculoskeletal negative musculoskeletal ROS (+)   Abdominal   Peds negative pediatric ROS (+)  Hematology negative hematology ROS (+)   Anesthesia Other Findings   Reproductive/Obstetrics negative OB ROS                             Anesthesia Physical Anesthesia Plan  ASA: III and emergent  Anesthesia Plan: General   Post-op Pain Management:    Induction: Intravenous and Rapid sequence  PONV Risk Score and Plan: 3 and Ondansetron, Dexamethasone and Treatment may vary due to age or medical condition  Airway Management Planned: Oral ETT  Additional Equipment:   Intra-op Plan:   Post-operative Plan: Possible Post-op intubation/ventilation  Informed Consent: I have reviewed the patients History and Physical, chart, labs and discussed the procedure including the risks, benefits and alternatives for the proposed anesthesia with the patient or authorized representative who has indicated his/her understanding and acceptance.     Dental advisory given  Plan Discussed with: CRNA and Surgeon  Anesthesia Plan Comments:          Anesthesia Quick Evaluation

## 2019-07-31 NOTE — Anesthesia Procedure Notes (Signed)
Arterial Line Insertion Start/End12/22/2020 11:10 PM, 07/31/2019 11:15 PM Performed by: Jearld Pies, CRNA, CRNA  Patient location: OOR procedure area. Emergency situation Left, radial was placed Catheter size: 20 G Hand hygiene performed  and Seldinger technique used Allen's test indicative of satisfactory collateral circulation Attempts: 2 Procedure performed without using ultrasound guided technique. Following insertion, dressing applied and Biopatch. Post procedure assessment: normal  Patient tolerated the procedure well with no immediate complications.

## 2019-07-31 NOTE — ED Triage Notes (Signed)
Pt brought to ED by [facility] from home for c/o right side gaze, left side weakness, slurred speech right facial droop, LSW today at noon. Pt is AO x 4 on arrival to ED. BP 140/70, HR 86, R 18, 94% on RA.

## 2019-07-31 NOTE — ED Notes (Signed)
Dr. Colvin Caroli, EDP at the bedside.

## 2019-07-31 NOTE — Sedation Documentation (Signed)
Spoke with [place], [place], 4N Charge. Requested O2 tank to be placed on bed.

## 2019-07-31 NOTE — Code Documentation (Signed)
Responded to Code Stroke called at 2107 for L sided weakness, R sided gaze, and slurred speech, [id number]. Pt arrived at 2126, NIH-18, CBG-325.  CT-hyperdense R MCA compatible with acute thrombus. Probable early infarct R lateral basal ganglia.   CTA-Acute occlusion of the terminal right internal carotid artery extending into the right M1 and A1 segments. There is collateral circulation with decreased perfusion in the right MCA territory. 50% diameter stenosis proximal left subclavian artery.Right carotid atherosclerotic disease without significantstenosis. 50% diameter stenosis proximal left internal carotid Artery.  CTP-Prenumbra-95, core 8cc. IR paged out at 2219. Pt prepped and transported to IR at 2235.

## 2019-07-31 NOTE — Sedation Documentation (Signed)
Message sent to Dr. Cheral Marker to place admission orders ASAP.

## 2019-07-31 NOTE — ED Notes (Signed)
Pt's daughter waiting in consultation room B.

## 2019-07-31 NOTE — Sedation Documentation (Signed)
Spoke with [facility] in pt placement. Pt to be admitted to [facility].

## 2019-08-01 ENCOUNTER — Inpatient Hospital Stay (HOSPITAL_COMMUNITY): Payer: Medicare Other

## 2019-08-01 ENCOUNTER — Encounter: Payer: Self-pay | Admitting: *Deleted

## 2019-08-01 DIAGNOSIS — E1165 Type 2 diabetes mellitus with hyperglycemia: Secondary | ICD-10-CM | POA: Diagnosis present

## 2019-08-01 DIAGNOSIS — I69391 Dysphagia following cerebral infarction: Secondary | ICD-10-CM

## 2019-08-01 DIAGNOSIS — I6389 Other cerebral infarction: Secondary | ICD-10-CM

## 2019-08-01 DIAGNOSIS — R531 Weakness: Secondary | ICD-10-CM

## 2019-08-01 DIAGNOSIS — D62 Acute posthemorrhagic anemia: Secondary | ICD-10-CM | POA: Diagnosis not present

## 2019-08-01 DIAGNOSIS — I6601 Occlusion and stenosis of right middle cerebral artery: Secondary | ICD-10-CM | POA: Diagnosis present

## 2019-08-01 DIAGNOSIS — Z8673 Personal history of transient ischemic attack (TIA), and cerebral infarction without residual deficits: Secondary | ICD-10-CM

## 2019-08-01 DIAGNOSIS — I616 Nontraumatic intracerebral hemorrhage, multiple localized: Secondary | ICD-10-CM | POA: Diagnosis present

## 2019-08-01 HISTORY — PX: IR ANGIO VERTEBRAL SEL SUBCLAVIAN INNOMINATE UNI R MOD SED: IMG5365

## 2019-08-01 HISTORY — PX: IR PERCUTANEOUS ART THROMBECTOMY/INFUSION INTRACRANIAL INC DIAG ANGIO: IMG6087

## 2019-08-01 HISTORY — PX: IR CT HEAD LTD: IMG2386

## 2019-08-01 HISTORY — PX: IR ANGIO INTRA EXTRACRAN SEL COM CAROTID INNOMINATE UNI L MOD SED: IMG5358

## 2019-08-01 LAB — LIPID PANEL
Cholesterol: 99 mg/dL (ref 0–200)
HDL: 42 mg/dL (ref >40)
LDL Cholesterol: 50 mg/dL (ref 0–99)
Total CHOL/HDL Ratio: 2.4 RATIO
Triglycerides: 34 mg/dL (ref <150)
VLDL: 7 mg/dL (ref 0–40)

## 2019-08-01 LAB — CBC WITH DIFFERENTIAL/PLATELET
Abs Immature Granulocytes: 0.05 10*3/uL (ref 0.00–0.07)
Basophils Absolute: 0 10*3/uL (ref 0.0–0.1)
Basophils Relative: 0 %
Eosinophils Absolute: 0 10*3/uL (ref 0.0–0.5)
Eosinophils Relative: 0 %
HCT: 34.5 % — ABNORMAL LOW (ref 36.0–46.0)
Hemoglobin: 11.4 g/dL — ABNORMAL LOW (ref 12.0–15.0)
Immature Granulocytes: 1 %
Lymphocytes Relative: 8 %
Lymphs Abs: 0.8 10*3/uL (ref 0.7–4.0)
MCH: 30.5 pg (ref 26.0–34.0)
MCHC: 33 g/dL (ref 30.0–36.0)
MCV: 92.2 fL (ref 80.0–100.0)
Monocytes Absolute: 0.4 10*3/uL (ref 0.1–1.0)
Monocytes Relative: 5 %
Neutro Abs: 8.4 10*3/uL — ABNORMAL HIGH (ref 1.7–7.7)
Neutrophils Relative %: 86 %
Platelets: 223 10*3/uL (ref 150–400)
RBC: 3.74 MIL/uL — ABNORMAL LOW (ref 3.87–5.11)
RDW: 12.4 % (ref 11.5–15.5)
WBC: 9.7 10*3/uL (ref 4.0–10.5)
nRBC: 0 % (ref 0.0–0.2)

## 2019-08-01 LAB — GLUCOSE, CAPILLARY
Glucose-Capillary: 106 mg/dL — ABNORMAL HIGH (ref 70–99)
Glucose-Capillary: 114 mg/dL — ABNORMAL HIGH (ref 70–99)
Glucose-Capillary: 126 mg/dL — ABNORMAL HIGH (ref 70–99)
Glucose-Capillary: 167 mg/dL — ABNORMAL HIGH (ref 70–99)
Glucose-Capillary: 229 mg/dL — ABNORMAL HIGH (ref 70–99)
Glucose-Capillary: 274 mg/dL — ABNORMAL HIGH (ref 70–99)

## 2019-08-01 LAB — BASIC METABOLIC PANEL
Anion gap: 11 (ref 5–15)
BUN: 17 mg/dL (ref 8–23)
CO2: 22 mmol/L (ref 22–32)
Calcium: 8.4 mg/dL — ABNORMAL LOW (ref 8.9–10.3)
Chloride: 102 mmol/L (ref 98–111)
Creatinine, Ser: 1.01 mg/dL — ABNORMAL HIGH (ref 0.44–1.00)
GFR calc Af Amer: 56 mL/min — ABNORMAL LOW (ref >60)
GFR calc non Af Amer: 48 mL/min — ABNORMAL LOW (ref >60)
Glucose, Bld: 287 mg/dL — ABNORMAL HIGH (ref 70–99)
Potassium: 3.9 mmol/L (ref 3.5–5.1)
Sodium: 135 mmol/L (ref 135–145)

## 2019-08-01 LAB — ECHOCARDIOGRAM COMPLETE
Height: 64 in
Weight: 2398.6 oz

## 2019-08-01 LAB — HEMOGLOBIN A1C
Hgb A1c MFr Bld: 6.9 % — ABNORMAL HIGH (ref 4.8–5.6)
Mean Plasma Glucose: 151.33 mg/dL

## 2019-08-01 LAB — MRSA PCR SCREENING: MRSA by PCR: NEGATIVE

## 2019-08-01 LAB — ETHANOL: Alcohol, Ethyl (B): <10 mg/dL (ref <10)

## 2019-08-01 MED ORDER — IOHEXOL 300 MG/ML  SOLN
150.0000 mL | Freq: Once | INTRAMUSCULAR | Status: AC | PRN
  Administered 2019-08-01: 90 mL via INTRA_ARTERIAL

## 2019-08-01 MED ORDER — CLEVIDIPINE BUTYRATE 0.5 MG/ML IV EMUL
0.0000 mg/h | INTRAVENOUS | Status: DC
  Administered 2019-08-01: 1 mg/h via INTRAVENOUS
  Filled 2019-08-01: qty 50

## 2019-08-01 MED ORDER — ONDANSETRON HCL 4 MG/2ML IJ SOLN
INTRAMUSCULAR | Status: DC | PRN
  Administered 2019-08-01: 4 mg via INTRAVENOUS

## 2019-08-01 MED ORDER — ACETAMINOPHEN 160 MG/5ML PO SOLN: 650.0000 mg | ORAL | Status: DC | PRN

## 2019-08-01 MED ORDER — SODIUM CHLORIDE 0.9 % IV SOLN: INTRAVENOUS | Status: DC

## 2019-08-01 MED ORDER — ACETAMINOPHEN 650 MG RE SUPP: 650.0000 mg | RECTAL | Status: DC | PRN

## 2019-08-01 MED ORDER — CHLORHEXIDINE GLUCONATE CLOTH 2 % EX PADS
6.0000 | MEDICATED_PAD | Freq: Every day | CUTANEOUS | Status: DC
  Administered 2019-08-01 – 2019-08-09 (×7): 6 via TOPICAL

## 2019-08-01 MED ORDER — PERFLUTREN LIPID MICROSPHERE
1.0000 mL | INTRAVENOUS | Status: AC | PRN
  Administered 2019-08-01: 2 mL via INTRAVENOUS
  Filled 2019-08-01: qty 10

## 2019-08-01 MED ORDER — ESMOLOL HCL 100 MG/10ML IV SOLN
INTRAVENOUS | Status: DC | PRN
  Administered 2019-08-01 (×3): 20 ug via INTRAVENOUS

## 2019-08-01 MED ORDER — LABETALOL HCL 5 MG/ML IV SOLN
INTRAVENOUS | Status: DC | PRN
  Administered 2019-08-01: 5 mg via INTRAVENOUS
  Administered 2019-08-01 (×4): 2.5 mg via INTRAVENOUS

## 2019-08-01 MED ORDER — ONDANSETRON HCL 4 MG/2ML IJ SOLN: 4.0000 mg | Freq: Four times a day (QID) | INTRAMUSCULAR | Status: DC | PRN

## 2019-08-01 MED ORDER — ACETAMINOPHEN 325 MG PO TABS
650.0000 mg | ORAL_TABLET | ORAL | Status: DC | PRN
  Administered 2019-08-03 – 2019-08-08 (×11): 650 mg via ORAL
  Filled 2019-08-01 (×11): qty 2

## 2019-08-01 MED ORDER — SUGAMMADEX SODIUM 200 MG/2ML IV SOLN
INTRAVENOUS | Status: DC | PRN
  Administered 2019-08-01 (×2): 100 mg via INTRAVENOUS

## 2019-08-01 NOTE — Progress Notes (Signed)
Patient ID: Veronica Holland, female   DOB: 1925/07/23, 85 y.o.   MRN: [id number] INR. Post procedure CT brain No ICH or mass effect.contrast stain in the RT  [place] region. RT groin puncture site sealed with an 8Fangioseal closure device, Distal pulses dopplerable DP and PT bilaterally. Extubated. Pupils 2 to 3 mm RT = LT . LT facial droop. Moves LT LE  But not LT UE.Marland Kitchen RT groin soft. S.Chastelyn Athens MD

## 2019-08-01 NOTE — Sedation Documentation (Signed)
SBAR called to [employer], Therapist, sports. All questions answered.

## 2019-08-01 NOTE — Progress Notes (Signed)
Patient ID: Veronica Holland, female   DOB: 1925-03-03, 85 y.o.   MRN: [id number] INR. 79 Y RT F mrss 0 LSW  12 noon  nEW ONSET OF lT SIDED WEAKNESS AND rT GAZE DEVIATION. Ct BRAIN no ich. Aspects 8-9. CTA Occluded RT ICA ,RT MCA and RT ACA . CTP maps reveal  A core of 7ml and Tmax> 6s of 103 ml with a mismatch of 12ml. Endovascular treatment D/W daughter in a 3 way call.. Procedure,reasons alternatives reviewed . Risks of ICH of 10 % with worsening neuro deficit,death ,inability to revascularize discussed. Daughter expressed understanding and consented to the treatment. S.Itzayanna Kaster MD

## 2019-08-01 NOTE — Evaluation (Signed)
Occupational Therapy Evaluation Patient Details Name: Veronica Holland MRN: [id number] DOB: [birth year] Today's Date: 08/01/2019    History of Present Illness 85 yo female s/p IR revasularization of R MCA with CT (+) R MCA occlusion PMH   has a past medical history of Diabetes mellitus without complication ([place]) and Stroke ([place]).    Clinical Impression   PT admitted with R MCA with revascularixation. Pt currently with functional limitiations due to the deficits listed below (see OT problem list). Pt currently with L facial droop, L Ue weaknes and L LE weakness. L LE is stronger than L UE. Pt requires mod (A) to roll and max (A) for sit<>supine. Pt bed level only evaluation.  Pt will benefit from skilled OT to increase their independence and safety with adls and balance to allow discharge CIR.     Follow Up Recommendations  CIR    Equipment Recommendations  Other (comment)(TBA)    Recommendations for Other Services Rehab consult     Precautions / Restrictions Precautions Precautions: Fall      Mobility Bed Mobility Overal bed mobility: Needs Assistance Bed Mobility: Rolling;Supine to Sit;Sit to Supine Rolling: Mod assist   Supine to sit: Max assist Sit to supine: Max assist   General bed mobility comments: requires (A) with bil LE off EOB. unable to push up from bed surface with L UE. unsteady at EOB needing (A) to sustain  Transfers                 General transfer comment: not appropriate at this time    Balance                                           ADL either performed or assessed with clinical judgement   ADL Overall ADL's : Needs assistance/impaired Eating/Feeding: NPO   Grooming: Minimal assistance;Sitting   Upper Body Bathing: Moderate assistance   Lower Body Bathing: Maximal assistance   Upper Body Dressing : Moderate assistance   Lower Body Dressing: Maximal assistance                 General ADL Comments: bed level  only evaluation due to Dr Leonie Man request of therapy. Will progress next session to oOB if appropriate     Vision   Vision Assessment?: Yes Ocular Range of Motion: Impaired-to be further tested in functional context Tracking/Visual Pursuits: Impaired - to be further tested in functional context Convergence: Impaired - to be further tested in functional context Additional Comments: R gaze preference needs cues to look L     Perception     Praxis      Pertinent Vitals/Pain Pain Assessment: No/denies pain     Hand Dominance Right   Extremity/Trunk Assessment Upper Extremity Assessment Upper Extremity Assessment: LUE deficits/detail LUE Deficits / Details: able to abduct shoudler, decrease shoulder flexion and sustaining , able to complete wrist flexion/ extension. able to move digits,  elbow flexion present.  LUE Sensation: WNL LUE Coordination: decreased fine motor;decreased gross motor   Lower Extremity Assessment Lower Extremity Assessment: Defer to PT evaluation   Cervical / Trunk Assessment Cervical / Trunk Assessment: Normal   Communication Communication Communication: Other (comment)(some slurred speech from previous CVA)   Cognition Arousal/Alertness: Awake/alert Behavior During Therapy: WFL for tasks assessed/performed Overall Cognitive Status: Within Functional Limits for tasks assessed  General Comments  HR 30-48, RR > 94 % RA, BP stable 118/65 in sitting Bradycardia    Exercises     Shoulder Instructions      Home Living Family/patient expects to be discharged to:: Inpatient rehab Living Arrangements: Alone Available Help at Discharge: Family;Available PRN/intermittently Type of Home: House Home Access: Stairs to enter [employer] of Steps: 1-3 Entrance Stairs-Rails: None Home Layout: One level     Bathroom Shower/Tub: Producer, television/film/video: Standard     Home Equipment:  None   Additional Comments: daughter calls daily at 6pm , drives  Lives With: Alone    Prior Functioning/Environment Level of Independence: Independent                 OT Problem List: Decreased strength;Decreased activity tolerance;Impaired balance (sitting and/or standing);Decreased coordination;Decreased safety awareness;Decreased knowledge of use of DME or AE;Decreased knowledge of precautions;Impaired UE functional use;Cardiopulmonary status limiting activity;Impaired vision/perception      OT Treatment/Interventions: Self-care/ADL training;Therapeutic exercise;Neuromuscular education;Energy conservation;DME and/or AE instruction;Manual therapy;Therapeutic activities;Visual/perceptual remediation/compensation;Patient/family education;Balance training    OT Goals(Current goals can be found in the care plan section) Acute Rehab OT Goals Patient Stated Goal: to get rehab OT Goal Formulation: With patient Time For Goal Achievement: 08/15/19 Potential to Achieve Goals: Good  OT Frequency: Min 3X/week   Barriers to D/C:            Co-evaluation PT/OT/SLP Co-Evaluation/Treatment: Yes Reason for Co-Treatment: For patient/therapist safety;To address functional/ADL transfers   OT goals addressed during session: ADL's and self-care;Strengthening/ROM      AM-PAC OT "6 Clicks" Daily Activity     Outcome Measure Help from another person eating meals?: A Lot Help from another person taking care of personal grooming?: A Lot Help from another person toileting, which includes using toliet, bedpan, or urinal?: A Lot Help from another person bathing (including washing, rinsing, drying)?: A Lot Help from another person to put on and taking off regular upper body clothing?: A Lot Help from another person to put on and taking off regular lower body clothing?: Total 6 Click Score: 11   End of Session Nurse Communication: Mobility status;Precautions  Activity Tolerance: Patient  tolerated treatment well Patient left: in bed;with call bell/phone within reach;with bed alarm set;with SCD's reapplied  OT Visit Diagnosis: Unsteadiness on feet (R26.81);Muscle weakness (generalized) (M62.81)                Time: 8366-2947 OT Time Calculation (min): 30 min Charges:  OT General Charges $OT Visit: 1 Visit OT Evaluation $OT Eval Moderate Complexity: 1 Mod   Brynn, OTR/L  Acute Rehabilitation Services Pager: [phone] Office: [phone] .   Mateo Flow 08/01/2019, 1:19 PM

## 2019-08-01 NOTE — Progress Notes (Signed)
  Echocardiogram 2D Echocardiogram has been performed.  Burnett Kanaris 08/01/2019, 9:16 AM

## 2019-08-01 NOTE — Sedation Documentation (Signed)
Out of room. PACU aware and agreeable to recover pt in [id number].

## 2019-08-01 NOTE — Evaluation (Signed)
Clinical/Bedside Swallow Evaluation Patient Details  Name: Veronica Holland MRN: [id number] Date of Birth: Nov 28, 1924  Today's Date: 08/01/2019 Time: SLP Start Time (ACUTE ONLY): 1055 SLP Stop Time (ACUTE ONLY): 1110 SLP Time Calculation (min) (ACUTE ONLY): 15 min  Past Medical History:  Past Medical History:  Diagnosis Date  . Diabetes mellitus without complication ([place])   . Stroke [facility])    Past Surgical History:  Past Surgical History:  Procedure Laterality Date  . HYSTERECTOMY ABDOMINAL WITH SALPINGECTOMY     HPI:  85yo female admitted 07/31/2019 with left weakness, right gaze preference, dysarthria.  PMH: CVA (04/2019) DM2, HTN, PAFib, CKD3, HLD, diverticulosis, OA, peripheral neuropathy. HeadCT = RMCA, early infarct R lateral basal ganglia. Intubated for IR. MRI pending   Assessment / Plan / Recommendation Clinical Impression  Pt presents with adequate dentition. Left facial weakness evident, with dysarthric speech decreasing intelligibility. Oral care was completed with suction, which pt tolerated well. She was noted to keep her eyes closed unless cued to open them. Pt accepted trials of ice chips, thin liquid, nectar thick liquid, and puree textures. Poor awareness of oral bolus was noted, with oral holding of each consistency. Pt required ongoing verbal and tactile cues to maintain attention to task and swallow the bolus. Multiple swallows were noted per bolus, raising concern for discoordination of bolus prep and posterior propulsion. Minimal left anterior leakage noted. No oral residue noted after po trials.   At this time, PO intake will likely be an arduous task, with pt requiring consistent cues to swallow each bolus. Pt is at risk for inadequate PO intake, due to the need for ongoing cues. She did not exhibit overt s/s aspiration on any consistency, but given probably discoordination of swallow, will begin conservative diet of puree and nectar thick liquid to minimize aspiration  risk. Recommend meds crushed in puree, and 1:1 close supervision/assist with all po intake. SLP will continue to follow to assess tolerance of po diet, and readiness to advance textures. MD and RN informed.   SLP Visit Diagnosis: Dysphagia, unspecified (R13.10)    Aspiration Risk  Mild aspiration risk;Moderate aspiration risk;Risk for inadequate nutrition/hydration    Diet Recommendation Dysphagia 1 (Puree);Nectar-thick liquid   Liquid Administration via: Cup;Straw Medication Administration: Crushed with puree Supervision: Staff to assist with self feeding;Full supervision/cueing for compensatory strategies Compensations: Minimize environmental distractions;Slow rate;Small sips/bites Postural Changes: Seated upright at 90 degrees;Remain upright for at least 30 minutes after po intake    Other  Recommendations Oral Care Recommendations: Oral care QID Other Recommendations: Have oral suction available   Follow up Recommendations Inpatient Rehab      Frequency and Duration min 2x/week  2 weeks;1 week       Prognosis Prognosis for Safe Diet Advancement: Good      Swallow Study   General Date of Onset: 07/31/19 HPI: 85yo female admitted 07/31/2019 with left weakness, right gaze preference, dysarthria.  PMH: CVA (04/2019) DM2, HTN, PAFib, CKD3, HLD, diverticulosis, OA, peripheral neuropathy. HeadCT = RMCA, early infarct R lateral basal ganglia. Intubated for IR. MRI pending Type of Study: Bedside Swallow Evaluation Previous Swallow Assessment: none Diet Prior to this Study: NPO Temperature Spikes Noted: No Respiratory Status: Room air History of Recent Intubation: Yes Length of Intubations (days): 1 days Date extubated: 07/31/19 Behavior/Cognition: Cooperative;Pleasant mood;Requires cueing Oral Cavity Assessment: Within Functional Limits Oral Care Completed by SLP: Yes Oral Cavity - Dentition: Adequate natural dentition Vision: Impaired for self-feeding(keeps eyes  closed) Self-Feeding Abilities: Total assist Patient Positioning:  Upright in bed Baseline Vocal Quality: Low vocal intensity Volitional Cough: Strong Volitional Swallow: Able to elicit(with encouragement)    Oral/Motor/Sensory Function Overall Oral Motor/Sensory Function: Moderate impairment Facial ROM: Reduced left Facial Symmetry: Abnormal symmetry left Facial Strength: Reduced left Facial Sensation: Within Functional Limits Lingual ROM: Within Functional Limits Lingual Symmetry: Within Functional Limits Lingual Strength: Within Functional Limits Lingual Sensation: Within Functional Limits Velum: Within Functional Limits Mandible: Within Functional Limits   Ice Chips Ice chips: Impaired Presentation: Spoon Oral Phase Impairments: Poor awareness of bolus Pharyngeal Phase Impairments: Suspected delayed Swallow;Multiple swallows   Thin Liquid Thin Liquid: Impaired Oral Phase Impairments: Poor awareness of bolus Oral Phase Functional Implications: Left anterior spillage Pharyngeal  Phase Impairments: Suspected delayed Swallow;Multiple swallows    Nectar Thick Nectar Thick Liquid: Impaired Oral Phase Impairments: Poor awareness of bolus Oral phase functional implications: Oral holding Pharyngeal Phase Impairments: Suspected delayed Swallow   Honey Thick Honey Thick Liquid: Not tested   Puree Puree: Impaired Oral Phase Impairments: Poor awareness of bolus Oral Phase Functional Implications: Prolonged oral transit;Oral holding Pharyngeal Phase Impairments: Suspected delayed Swallow   Solid     Solid: Not tested     Elisabeth Pigeon, CCC-SLP Speech Language Pathologist Office: [phone] Pager: [phone]  Leigh Aurora 08/01/2019,11:33 AM

## 2019-08-01 NOTE — Procedures (Signed)
S/P bilateral common carotid arteriograms followed by complete revascularization of RT ICA terminus,RT MCA prox and RT ACA prox with x 1 pass with 53mm x 40 mm X soliaire retriever device and penumbra aspiration  Achieving a TICI 3 revascularization. S.Moishy Laday MD

## 2019-08-01 NOTE — Anesthesia Postprocedure Evaluation (Signed)
Anesthesia Post Note  Patient: Veronica Holland  Procedure(s) Performed: IR WITH ANESTHESIA (N/A )     Anesthesia Post Evaluation  Last Vitals:  Vitals:   08/01/19 0600 08/01/19 0700  BP: (!) 101/41 (!) 117/55  Pulse: (!) 58 62  Resp: 19 (!) 22  Temp:    SpO2: 94% 96%    Last Pain:  Vitals:   08/01/19 0400  TempSrc: Axillary  PainSc:                  Terita Hejl S

## 2019-08-01 NOTE — Consult Note (Signed)
Physical Medicine and Rehabilitation Consult Reason for Consult: Left side weakness Referring Physician: [facility]   HPI: Jernie Avitabile is a 85 y.o. right-handed female with history of diabetes mellitus as well as stroke receiving TPA maintain on aspirin and Plavix.  History taken from chart review and grandson.  Also evaluated with neurology.  Patient lives alone reportedly independent prior to admission.  1 level home.  Presented 07/29/2019 with left-sided weakness.  Cranial CT scan showed hyperdense right MCA compatible with acute thrombus.  Probable early infarct right lateral basal ganglia.  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  Collateral circulation with decreased perfusion in the right MCA territory.  Patient underwent revascularization per interventional radiology.  Repeat MRI brain personally reviewed, showing right brain hemorrhage and infarcts.  Per report right foot pain, caudate with extension into the right temporal lobe and small areas of infarct in frontal and occipital lobe.  Echocardiogram with ejection fraction of 60% without emboli.  Neurology to follow-up to establish anticoagulation.  Currently on Cleviprex for blood pressure control.  Hospital course complicated by dysphagia, started on dysphagia #1 nectar thick liquid.  Therapy evaluation completed with recommendations of physical medicine rehab consult.  Review of Systems  Unable to perform ROS: Mental acuity   Past Medical History:  Diagnosis Date  . Diabetes mellitus without complication (HCC)   . Stroke [facility])    Past Surgical History:  Procedure Laterality Date  . HYSTERECTOMY ABDOMINAL WITH SALPINGECTOMY     History reviewed. No pertinent family history, unable to obtain from patient. Social History:  reports that she has never smoked. She has never used smokeless tobacco. She reports previous alcohol use.  She reports that she does not use drugs. Allergies: Not on File Medications Prior to Admission  Medication Sig Dispense Refill  . aspirin EC 81 MG tablet Take 81 mg by mouth daily.    . calcium-vitamin D (OSCAL WITH D) 500-200 MG-UNIT tablet Take 1 tablet by mouth daily with breakfast.    . clopidogrel (PLAVIX) 75 MG tablet Take 75 mg by mouth daily.    . insulin aspart (NOVOLOG) 100 UNIT/ML injection Inject 16 Units into the skin 3 (three) times daily before meals.    . insulin detemir (LEVEMIR) 100 UNIT/ML injection Inject 25 Units into the skin daily.    Marland Kitchen losartan (COZAAR) 100 MG tablet Take 25 mg by mouth daily.      Home: Home Living Family/patient expects to be discharged to:: Inpatient rehab Living Arrangements: Alone Available Help at Discharge: Family, Available PRN/intermittently Type of Home: House Home Access: Stairs to enter Secretary/administrator of Steps: 1-3 Entrance Stairs-Rails: None Home Layout: One level Bathroom Shower/Tub: Health visitor: Standard Home Equipment: None Additional Comments: daughter calls daily at 6pm , drives  Lives With: Alone  Functional History: Prior Function Level of Independence: Independent Functional Status:  Mobility: Bed Mobility Overal bed mobility: Needs Assistance Bed Mobility: Rolling, Supine to Sit, Sit to Supine Rolling: Mod assist Supine to sit: Max assist Sit to supine: Max assist General bed mobility comments: requires (A) with bil LE off EOB. unable to push up from bed surface with L UE. unsteady at EOB needing (A) to sustain Transfers General transfer comment: not appropriate at this time      ADL: ADL Overall ADL's : Needs assistance/impaired Eating/Feeding: NPO Grooming: Minimal assistance, Sitting Upper Body Bathing:  Moderate assistance Lower Body Bathing: Maximal assistance Upper Body Dressing : Moderate assistance Lower Body Dressing: Maximal assistance General ADL Comments: bed level  only evaluation due to Dr Pearlean Brownie request of therapy. Will progress next session to oOB if appropriate  Cognition: Cognition Overall Cognitive Status: Within Functional Limits for tasks assessed Orientation Level: Oriented X4 Cognition Arousal/Alertness: Awake/alert Behavior During Therapy: WFL for tasks assessed/performed Overall Cognitive Status: Within Functional Limits for tasks assessed  Blood pressure (!) 113/54, pulse 62, temperature 99.4 F (37.4 C), temperature source Axillary, resp. rate 18, height  (1.626 m), weight 68 kg, SpO2 95 %. Physical Exam  Vitals reviewed. Constitutional: She appears well-developed and well-nourished.  HENT:  Head: Normocephalic and atraumatic.  Eyes: EOM are normal. Right eye exhibits no discharge. Left eye exhibits no discharge.  Only briefly opens eyes.  Neck: No tracheal deviation present. No thyromegaly present.  Respiratory: Effort normal. No respiratory distress.  GI: Soft. She exhibits no distension.  Musculoskeletal:     Comments: No edema or tenderness in extremities  Neurological: She is alert.  Somnolent, with difficulty staying awake Able to wiggle fingers and toes on left  Skin: Skin is warm and dry.  Psychiatric:  Unable to assess due to mentation    Results for orders placed or performed during the hospital encounter of 07/31/19 (from the past 24 hour(s))  I-stat chem 8, ED     Status: Abnormal   Collection Time: 07/31/19  9:33 PM  Result Value Ref Range   Sodium 137 135 - 145 mmol/L   Potassium 4.4 3.5 - 5.1 mmol/L   Chloride 104 98 - 111 mmol/L   BUN 23 8 - 23 mg/dL   Creatinine, Ser 1.61 0.44 - 1.00 mg/dL   Glucose, Bld 096 (H) 70 - 99 mg/dL   Calcium, Ion 0.45 (L) 1.15 - 1.40 mmol/L   TCO2 23 22 - 32 mmol/L   Hemoglobin 13.3 12.0 - 15.0 g/dL   HCT 40.9 81.1 - 91.4 %  Protime-INR     Status: None   Collection Time: 07/31/19  9:33 PM  Result Value Ref Range   Prothrombin Time 13.1 11.4 - 15.2 seconds   INR  1.0 0.8 - 1.2  APTT     Status: None   Collection Time: 07/31/19  9:33 PM  Result Value Ref Range   aPTT 25 24 - 36 seconds  CBC     Status: None   Collection Time: 07/31/19  9:33 PM  Result Value Ref Range   WBC 8.5 4.0 - 10.5 K/uL   RBC 4.17 3.87 - 5.11 MIL/uL   Hemoglobin 12.6 12.0 - 15.0 g/dL   HCT 78.2 95.6 - 21.3 %   MCV 94.2 80.0 - 100.0 fL   MCH 30.2 26.0 - 34.0 pg   MCHC 32.1 30.0 - 36.0 g/dL   RDW 08.6 57.8 - 46.9 %   Platelets 229 150 - 400 K/uL   nRBC 0.0 0.0 - 0.2 %  Differential     Status: Abnormal   Collection Time: 07/31/19  9:33 PM  Result Value Ref Range   Neutrophils Relative % 91 %   Neutro Abs 7.8 (H) 1.7 - 7.7 K/uL   Lymphocytes Relative 6 %   Lymphs Abs 0.5 (L) 0.7 - 4.0 K/uL   Monocytes Relative 3 %   Monocytes Absolute 0.2 0.1 - 1.0 K/uL   Eosinophils Relative 0 %   Eosinophils Absolute 0.0 0.0 - 0.5 K/uL   Basophils Relative 0 %  Basophils Absolute 0.0 0.0 - 0.1 K/uL   Immature Granulocytes 0 %   Abs Immature Granulocytes 0.02 0.00 - 0.07 K/uL  Comprehensive metabolic panel     Status: Abnormal   Collection Time: 07/31/19  9:33 PM  Result Value Ref Range   Sodium 134 (L) 135 - 145 mmol/L   Potassium 4.4 3.5 - 5.1 mmol/L   Chloride 103 98 - 111 mmol/L   CO2 21 (L) 22 - 32 mmol/L   Glucose, Bld 292 (H) 70 - 99 mg/dL   BUN 24 (H) 8 - 23 mg/dL   Creatinine, Ser 1.61 (H) 0.44 - 1.00 mg/dL   Calcium 8.9 8.9 - 09.6 mg/dL   Total Protein 6.6 6.5 - 8.1 g/dL   Albumin 3.7 3.5 - 5.0 g/dL   AST 19 15 - 41 U/L   ALT 15 0 - 44 U/L   Alkaline Phosphatase 61 38 - 126 U/L   Total Bilirubin 1.3 (H) 0.3 - 1.2 mg/dL   GFR calc non Af Amer 47 (L) >60 mL/min   GFR calc Af Amer 55 (L) >60 mL/min   Anion gap 10 5 - 15  CBG monitoring, ED     Status: Abnormal   Collection Time: 07/31/19 10:02 PM  Result Value Ref Range   Glucose-Capillary 268 (H) 70 - 99 mg/dL   Comment 1 Notify RN   Respiratory Panel by RT PCR (Flu A&B, Covid) - Nasopharyngeal Swab      Status: None   Collection Time: 07/31/19 10:06 PM   Specimen: Nasopharyngeal Swab  Result Value Ref Range   SARS Coronavirus 2 by RT PCR NEGATIVE NEGATIVE   Influenza A by PCR NEGATIVE NEGATIVE   Influenza B by PCR NEGATIVE NEGATIVE  Urine rapid drug screen (hosp performed)     Status: None   Collection Time: 07/31/19 10:27 PM  Result Value Ref Range   Opiates NONE DETECTED NONE DETECTED   Cocaine NONE DETECTED NONE DETECTED   Benzodiazepines NONE DETECTED NONE DETECTED   Amphetamines NONE DETECTED NONE DETECTED   Tetrahydrocannabinol NONE DETECTED NONE DETECTED   Barbiturates NONE DETECTED NONE DETECTED  Urinalysis, Routine w reflex microscopic     Status: Abnormal   Collection Time: 07/31/19 10:27 PM  Result Value Ref Range   Color, Urine YELLOW YELLOW   APPearance CLEAR CLEAR   Specific Gravity, Urine 1.024 1.005 - 1.030   pH 6.0 5.0 - 8.0   Glucose, UA >=500 (A) NEGATIVE mg/dL   Hgb urine dipstick NEGATIVE NEGATIVE   Bilirubin Urine NEGATIVE NEGATIVE   Ketones, ur 20 (A) NEGATIVE mg/dL   Protein, ur NEGATIVE NEGATIVE mg/dL   Nitrite NEGATIVE NEGATIVE   Leukocytes,Ua NEGATIVE NEGATIVE   RBC / HPF 0-5 0 - 5 RBC/hpf   Bacteria, UA NONE SEEN NONE SEEN   Squamous Epithelial / LPF 0-5 0 - 5   Mucus PRESENT   Glucose, capillary     Status: Abnormal   Collection Time: 08/01/19 12:32 AM  Result Value Ref Range   Glucose-Capillary 274 (H) 70 - 99 mg/dL  MRSA PCR Screening     Status: None   Collection Time: 08/01/19 12:58 AM   Specimen: Nasopharyngeal  Result Value Ref Range   MRSA by PCR NEGATIVE NEGATIVE  Hemoglobin A1c     Status: Abnormal   Collection Time: 08/01/19  1:30 AM  Result Value Ref Range   Hgb A1c MFr Bld 6.9 (H) 4.8 - 5.6 %   Mean Plasma Glucose 151.33 mg/dL  CBC with Differential/Platelet     Status: Abnormal   Collection Time: 08/01/19  2:22 AM  Result Value Ref Range   WBC 9.7 4.0 - 10.5 K/uL   RBC 3.74 (L) 3.87 - 5.11 MIL/uL   Hemoglobin 11.4 (L)  12.0 - 15.0 g/dL   HCT 40.9 (L) 81.1 - 91.4 %   MCV 92.2 80.0 - 100.0 fL   MCH 30.5 26.0 - 34.0 pg   MCHC 33.0 30.0 - 36.0 g/dL   RDW 78.2 95.6 - 21.3 %   Platelets 223 150 - 400 K/uL   nRBC 0.0 0.0 - 0.2 %   Neutrophils Relative % 86 %   Neutro Abs 8.4 (H) 1.7 - 7.7 K/uL   Lymphocytes Relative 8 %   Lymphs Abs 0.8 0.7 - 4.0 K/uL   Monocytes Relative 5 %   Monocytes Absolute 0.4 0.1 - 1.0 K/uL   Eosinophils Relative 0 %   Eosinophils Absolute 0.0 0.0 - 0.5 K/uL   Basophils Relative 0 %   Basophils Absolute 0.0 0.0 - 0.1 K/uL   Immature Granulocytes 1 %   Abs Immature Granulocytes 0.05 0.00 - 0.07 K/uL  Basic metabolic panel     Status: Abnormal   Collection Time: 08/01/19  2:22 AM  Result Value Ref Range   Sodium 135 135 - 145 mmol/L   Potassium 3.9 3.5 - 5.1 mmol/L   Chloride 102 98 - 111 mmol/L   CO2 22 22 - 32 mmol/L   Glucose, Bld 287 (H) 70 - 99 mg/dL   BUN 17 8 - 23 mg/dL   Creatinine, Ser 0.86 (H) 0.44 - 1.00 mg/dL   Calcium 8.4 (L) 8.9 - 10.3 mg/dL   GFR calc non Af Amer 48 (L) >60 mL/min   GFR calc Af Amer 56 (L) >60 mL/min   Anion gap 11 5 - 15  Lipid panel     Status: None   Collection Time: 08/01/19  3:55 AM  Result Value Ref Range   Cholesterol 99 0 - 200 mg/dL   Triglycerides 34 <578 mg/dL   HDL 42 >46 mg/dL   Total CHOL/HDL Ratio 2.4 RATIO   VLDL 7 0 - 40 mg/dL   LDL Cholesterol 50 0 - 99 mg/dL  Glucose, capillary     Status: Abnormal   Collection Time: 08/01/19  4:34 AM  Result Value Ref Range   Glucose-Capillary 229 (H) 70 - 99 mg/dL  Ethanol     Status: None   Collection Time: 08/01/19  5:37 AM  Result Value Ref Range   Alcohol, Ethyl (B) <10 <10 mg/dL  Glucose, capillary     Status: Abnormal   Collection Time: 08/01/19  8:11 AM  Result Value Ref Range   Glucose-Capillary 114 (H) 70 - 99 mg/dL   Comment 1 Notify RN    Comment 2 Document in Chart   Glucose, capillary     Status: Abnormal   Collection Time: 08/01/19 12:19 PM  Result Value Ref  Range   Glucose-Capillary 126 (H) 70 - 99 mg/dL   Comment 1 Notify RN    Comment 2 Document in Chart    CT Code Stroke CTA Head W/WO contrast  Result Date: 07/31/2019 CLINICAL DATA:  Stroke.  Left-sided weakness EXAM: CT ANGIOGRAPHY HEAD AND NECK CT PERFUSION BRAIN TECHNIQUE: Multidetector CT imaging of the head and neck was performed using the standard protocol during bolus administration of intravenous contrast. Multiplanar CT image reconstructions and MIPs were obtained to evaluate the vascular  anatomy. Carotid stenosis measurements (when applicable) are obtained utilizing NASCET criteria, using the distal internal carotid diameter as the denominator. Multiphase CT imaging of the brain was performed following IV bolus contrast injection. Subsequent parametric perfusion maps were calculated using RAPID software. CONTRAST:  OMNIPAQUE IOHEXOL 350 MG/ML SOLN 100 mL Isovue 370 IV COMPARISON:  CT head 07/31/2019 FINDINGS: Aortic arch: Standard branching. Imaged portion shows no evidence of aneurysm or dissection. No significant stenosis of the major arch vessel origins. Atherosclerotic disease in the aortic arch. Atherosclerotic plaque proximal left subclavian artery with approximately 50% diameter stenosis proximal to the vertebral artery. Right carotid system: Atherosclerotic calcification at the right carotid bifurcation without significant stenosis. Left carotid system: Atherosclerotic calcification left carotid bifurcation. Approximately 50% diameter stenosis proximal left internal carotid artery. Vertebral arteries: Right vertebral artery dominant. Both vertebral arteries patent to the basilar. Mild calcific stenosis at the origin of the vertebral bilaterally. Skeleton: Cervical spondylosis.  No acute skeletal abnormality. Other neck: 7 x 12 mm right thyroid nodule. No adenopathy in the neck. Upper chest: Lung apices clear bilaterally. Review of the MIP images confirms the above findings CTA HEAD  FINDINGS Anterior circulation: Atherosclerotic calcification right cavernous carotid which is patent without significant stenosis. Thrombus in the terminal right internal carotid artery extending into the right M1 and proximal right A1 segments. There is flow distal to the right M1 occlusion with decreased enhancement of right MCA branches. Right anterior cerebral artery patent supplied from the left Left cavernous carotid without significant stenosis. Diffuse atherosclerotic calcification. Left anterior and middle cerebral arteries patent bilaterally without stenosis. Posterior circulation: Both vertebral arteries patent to the basilar. PICA patent bilaterally. Basilar widely patent. Superior cerebellar and posterior cerebral arteries patent bilaterally without significant stenosis or occlusion. Venous sinuses: Limited venous contrast due to our field of arterial phase scanning. Anatomic variants: None CT Brain Perfusion Findings: ASPECTS: 9 CBF (<30%) Volume: 8mL Perfusion (Tmax>6.0s) volume: Mismatch Volume: 95mL Infarction Location:Core infarct right basal ganglia. Penumbra in the right temporal and parietal lobe in the right MCA territory. IMPRESSION: 1. 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 There is collateral circulation with decreased perfusion in the right MCA territory. 2. CT perfusion demonstrates 8 mL of core infarct in the right basal ganglia. 95 mL of penumbra in the right temporoparietal lobe. 3. 50% diameter stenosis proximal left subclavian artery. 4. Right carotid atherosclerotic disease without significant stenosis. 50% diameter stenosis proximal left internal carotid artery. 5. These results were called by telephone at the time of interpretation on 07/31/2019 at 10:11 pm to provider ERIC [facility] , who verbally acknowledged these results. Electronically Signed   By: Marlan Palau M.D.   On: 07/31/2019 22:12   CT Code  Stroke CTA Neck W/WO contrast  Result Date: 07/31/2019 CLINICAL DATA:  Stroke.  Left-sided weakness EXAM: CT ANGIOGRAPHY HEAD AND NECK CT PERFUSION BRAIN TECHNIQUE: Multidetector CT imaging of the head and neck was performed using the standard protocol during bolus administration of intravenous contrast. Multiplanar CT image reconstructions and MIPs were obtained to evaluate the vascular anatomy. Carotid stenosis measurements (when applicable) are obtained utilizing NASCET criteria, using the distal internal carotid diameter as the denominator. Multiphase CT imaging of the brain was performed following IV bolus contrast injection. Subsequent parametric perfusion maps were calculated using RAPID software. CONTRAST:  OMNIPAQUE IOHEXOL 350 MG/ML SOLN 100 mL Isovue 370 IV COMPARISON:  CT head 07/31/2019 FINDINGS: Aortic arch: Standard  branching. Imaged portion shows no evidence of aneurysm or dissection. No significant stenosis of the major arch vessel origins. Atherosclerotic disease in the aortic arch. Atherosclerotic plaque proximal left subclavian artery with approximately 50% diameter stenosis proximal to the vertebral artery. Right carotid system: Atherosclerotic calcification at the right carotid bifurcation without significant stenosis. Left carotid system: Atherosclerotic calcification left carotid bifurcation. Approximately 50% diameter stenosis proximal left internal carotid artery. Vertebral arteries: Right vertebral artery dominant. Both vertebral arteries patent to the basilar. Mild calcific stenosis at the origin of the vertebral bilaterally. Skeleton: Cervical spondylosis.  No acute skeletal abnormality. Other neck: 7 x 12 mm right thyroid nodule. No adenopathy in the neck. Upper chest: Lung apices clear bilaterally. Review of the MIP images confirms the above findings CTA HEAD FINDINGS Anterior circulation: Atherosclerotic calcification right cavernous carotid which is patent without  significant stenosis. Thrombus in the terminal right internal carotid artery extending into the right M1 and proximal right A1 segments. There is flow distal to the right M1 occlusion with decreased enhancement of right MCA branches. Right anterior cerebral artery patent supplied from the left Left cavernous carotid without significant stenosis. Diffuse atherosclerotic calcification. Left anterior and middle cerebral arteries patent bilaterally without stenosis. Posterior circulation: Both vertebral arteries patent to the basilar. PICA patent bilaterally. Basilar widely patent. Superior cerebellar and posterior cerebral arteries patent bilaterally without significant stenosis or occlusion. Venous sinuses: Limited venous contrast due to our field of arterial phase scanning. Anatomic variants: None CT Brain Perfusion Findings: ASPECTS: 9 CBF (<30%) Volume: 8mL Perfusion (Tmax>6.0s) volume: Mismatch Volume: 95mL Infarction Location:Core infarct right basal ganglia. Penumbra in the right temporal and parietal lobe in the right MCA territory. IMPRESSION: 1. 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 There is collateral circulation with decreased perfusion in the right MCA territory. 2. CT perfusion demonstrates 8 mL of core infarct in the right basal ganglia. 95 mL of penumbra in the right temporoparietal lobe. 3. 50% diameter stenosis proximal left subclavian artery. 4. Right carotid atherosclerotic disease without significant stenosis. 50% diameter stenosis proximal left internal carotid artery. 5. These results were called by telephone at the time of interpretation on 07/31/2019 at 10:11 pm to provider ERIC [facility] , who verbally acknowledged these results. Electronically Signed   By: Marlan Palau M.D.   On: 07/31/2019 22:12   CT Code Stroke Cerebral Perfusion with contrast  Result Date: 07/31/2019 CLINICAL DATA:  Stroke.  Left-sided weakness  EXAM: CT ANGIOGRAPHY HEAD AND NECK CT PERFUSION BRAIN TECHNIQUE: Multidetector CT imaging of the head and neck was performed using the standard protocol during bolus administration of intravenous contrast. Multiplanar CT image reconstructions and MIPs were obtained to evaluate the vascular anatomy. Carotid stenosis measurements (when applicable) are obtained utilizing NASCET criteria, using the distal internal carotid diameter as the denominator. Multiphase CT imaging of the brain was performed following IV bolus contrast injection. Subsequent parametric perfusion maps were calculated using RAPID software. CONTRAST:  OMNIPAQUE IOHEXOL 350 MG/ML SOLN 100 mL Isovue 370 IV COMPARISON:  CT head 07/31/2019 FINDINGS: Aortic arch: Standard branching. Imaged portion shows no evidence of aneurysm or dissection. No significant stenosis of the major arch vessel origins. Atherosclerotic disease in the aortic arch. Atherosclerotic plaque proximal left subclavian artery with approximately 50% diameter stenosis proximal to the vertebral artery. Right carotid system: Atherosclerotic calcification at the right carotid bifurcation without significant stenosis. Left carotid system: Atherosclerotic calcification left carotid bifurcation. Approximately 50%  diameter stenosis proximal left internal carotid artery. Vertebral arteries: Right vertebral artery dominant. Both vertebral arteries patent to the basilar. Mild calcific stenosis at the origin of the vertebral bilaterally. Skeleton: Cervical spondylosis.  No acute skeletal abnormality. Other neck: 7 x 12 mm right thyroid nodule. No adenopathy in the neck. Upper chest: Lung apices clear bilaterally. Review of the MIP images confirms the above findings CTA HEAD FINDINGS Anterior circulation: Atherosclerotic calcification right cavernous carotid which is patent without significant stenosis. Thrombus in the terminal right internal carotid artery extending into the right M1 and  proximal right A1 segments. There is flow distal to the right M1 occlusion with decreased enhancement of right MCA branches. Right anterior cerebral artery patent supplied from the left Left cavernous carotid without significant stenosis. Diffuse atherosclerotic calcification. Left anterior and middle cerebral arteries patent bilaterally without stenosis. Posterior circulation: Both vertebral arteries patent to the basilar. PICA patent bilaterally. Basilar widely patent. Superior cerebellar and posterior cerebral arteries patent bilaterally without significant stenosis or occlusion. Venous sinuses: Limited venous contrast due to our field of arterial phase scanning. Anatomic variants: None CT Brain Perfusion Findings: ASPECTS: 9 CBF (<30%) Volume: 75mL Perfusion (Tmax>6.0s) volume: Mismatch Volume: 61mL Infarction Location:Core infarct right basal ganglia. Penumbra in the right temporal and parietal lobe in the right MCA territory. IMPRESSION: 1. 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 There is collateral circulation with decreased perfusion in the right MCA territory. 2. CT perfusion demonstrates 8 mL of core infarct in the right basal ganglia. 95 mL of penumbra in the right temporoparietal lobe. 3. 50% diameter stenosis proximal left subclavian artery. 4. Right carotid atherosclerotic disease without significant stenosis. 50% diameter stenosis proximal left internal carotid artery. 5. These results were called by telephone at the time of interpretation on 07/31/2019 at 10:11 pm to provider ERIC [facility] , who verbally acknowledged these results. Electronically Signed   By: Marlan Palau M.D.   On: 07/31/2019 22:12   ECHOCARDIOGRAM COMPLETE  Result Date: 08/01/2019   ECHOCARDIOGRAM REPORT   Patient Name:   Idelia Hentges Date of Exam: 08/01/2019 Medical Rec #:  [id number]    Height:       64.0 in Accession #:    [id number]   Weight:        149.9 lb Date of Birth:  [birth year]     BSA:          1.73 m Patient Age:    89 years     BP:           116/63 mmHg Patient Gender: F            HR:           65 bpm. Exam Location:  Inpatient Procedure: 2D Echo, Cardiac Doppler, Color Doppler and Intracardiac            Opacification Agent Indications:    Stroke 434.91  History:        Patient has no prior history of Echocardiogram examinations.                 Stroke; Risk Factors:Non-Smoker.  Sonographer:    Tonia Ghent RDCS Referring Phys: [id number] ERIC LINDZEN IMPRESSIONS  1. Left ventricular ejection fraction, by visual estimation, is 55 to 60%. The left ventricle has normal function. There is no left ventricular hypertrophy.  2. Definity contrast agent was given IV to delineate the left ventricular endocardial borders.  3.  Elevated left atrial pressure.  4. Left ventricular diastolic parameters are consistent with Grade II diastolic dysfunction (pseudonormalization).  5. Global right ventricle has normal systolic function.The right ventricular size is normal.  6. Left atrial size was normal.  7. Right atrial size was normal.  8. Mild mitral annular calcification.  9. The mitral valve is normal in structure. Trivial mitral valve regurgitation. No evidence of mitral stenosis. 10. The tricuspid valve is normal in structure. 11. The aortic valve is tricuspid. Aortic valve regurgitation is not visualized. Mild aortic valve sclerosis without stenosis. 12. The pulmonic valve was normal in structure. Pulmonic valve regurgitation is trivial. 13. The inferior vena cava is normal in size with greater than 50% respiratory variability, suggesting right atrial pressure of 3 mmHg. 14. Definity used; normal LV systolic function; grade 2 diastolic dysfunction. FINDINGS  Left Ventricle: Left ventricular ejection fraction, by visual estimation, is 55 to 60%. The left ventricle has normal function. Definity contrast agent was given IV to delineate the left ventricular endocardial  borders. The left ventricle is not well visualized. There is no left ventricular hypertrophy. Left ventricular diastolic parameters are consistent with Grade II diastolic dysfunction (pseudonormalization). Elevated left atrial pressure. Right Ventricle: The right ventricular size is normal.Global RV systolic function is has normal systolic function. Left Atrium: Left atrial size was normal in size. Right Atrium: Right atrial size was normal in size Pericardium: There is no evidence of pericardial effusion. Mitral Valve: The mitral valve is normal in structure. Mild mitral annular calcification. Trivial mitral valve regurgitation. No evidence of mitral valve stenosis by observation. Tricuspid Valve: The tricuspid valve is normal in structure. Tricuspid valve regurgitation is trivial. Aortic Valve: The aortic valve is tricuspid. Aortic valve regurgitation is not visualized. Mild aortic valve sclerosis is present, with no evidence of aortic valve stenosis. Pulmonic Valve: The pulmonic valve was normal in structure. Pulmonic valve regurgitation is trivial. Pulmonic regurgitation is trivial. Aorta: The aortic root is normal in size and structure. Venous: The inferior vena cava was not well visualized. The inferior vena cava is normal in size with greater than 50% respiratory variability, suggesting right atrial pressure of 3 mmHg.  Additional Comments: Definity used; normal LV systolic function; grade 2 diastolic dysfunction.  LEFT VENTRICLE PLAX 2D LVIDd:         4.52 cm       Diastology LVIDs:         3.08 cm       LV e' lateral:   7.71 cm/s LV PW:         1.01 cm       LV E/e' lateral: 11.9 LV IVS:        1.00 cm       LV e' medial:    4.99 cm/s LVOT diam:     1.70 cm       LV E/e' medial:  18.4 LV SV:         56 ml LV SV Index:   31.76 LVOT Area:     2.27 cm  LV Volumes (MOD) LV area d, A2C:    28.90 cm LV area d, A4C:    29.00 cm LV area s, A2C:    16.60 cm LV area s, A4C:    17.90 cm LV major d, A2C:   7.84 cm  LV major d, A4C:   7.49 cm LV major s, A2C:   6.31 cm LV major s, A4C:   6.49 cm LV vol d, MOD  A2C: 88.8 ml LV vol d, MOD A4C: 93.2 ml LV vol s, MOD A2C: 38.0 ml LV vol s, MOD A4C: 41.7 ml LV SV MOD A2C:     50.8 ml LV SV MOD A4C:     93.2 ml LV SV MOD BP:      52.8 ml RIGHT VENTRICLE RV S prime:     9.20 cm/s TAPSE (M-mode): 1.8 cm LEFT ATRIUM           Index       RIGHT ATRIUM           Index LA diam:      3.50 cm 2.02 cm/m  RA Area:     13.20 cm LA Vol (A2C): 43.7 ml 25.25 ml/m RA Volume:   27.10 ml  15.66 ml/m LA Vol (A4C): 39.4 ml 22.76 ml/m  AORTIC VALVE LVOT Vmax:   75.70 cm/s LVOT Vmean:  53.200 cm/s LVOT VTI:    0.179 m  AORTA Ao Root diam: 2.80 cm MITRAL VALVE MV Area (PHT): 3.12 cm             SHUNTS MV PHT:        70.47 msec           Systemic VTI:  0.18 m MV Decel Time: 243 msec             Systemic Diam: 1.70 cm MV E velocity: 91.70 cm/s 103 cm/s MV A velocity: 46.70 cm/s 70.3 cm/s MV E/A ratio:  1.96       1.5  Kirk Ruths MD Electronically signed by Kirk Ruths MD Signature Date/Time: 08/01/2019/11:51:31 AM    Final    CT HEAD CODE STROKE WO CONTRAST  Result Date: 07/31/2019 CLINICAL DATA:  Code stroke.  Left-sided weakness. Slurred speech. EXAM: CT HEAD WITHOUT CONTRAST TECHNIQUE: Contiguous axial images were obtained from the base of the skull through the vertex without intravenous contrast. COMPARISON:  CT head 05/18/2027 FINDINGS: Brain: Mild atrophy. Patchy white matter hypodensity bilaterally is unchanged. Mild asymmetric hypodensity right lateral basal ganglia. Insular cortex normal. Negative for hemorrhage. Vascular: Hyperdense right MCA. Skull: Negative Sinuses/Orbits: Paranasal sinuses clear.  Bilateral cataract surgery Other: None ASPECTS ([place] Stroke Program Early CT Score) - Ganglionic level infarction (caudate, lentiform nuclei, internal capsule, insula, M1-M3 cortex): 6 - Supraganglionic infarction (M4-M6 cortex): 3 Total score (0-10 with 10 being normal): 9  IMPRESSION: 1. Hyperdense right MCA compatible with acute thrombus. Probable early infarct right lateral basal ganglia 2. ASPECTS is 9 3. These results were called by telephone at the time of interpretation on 07/31/2019 at 9:41 pm to provider Dr.Lindzen , who verbally acknowledged these results. Electronically Signed   By: Franchot Gallo M.D.   On: 07/31/2019 21:42    Assessment/Plan: Diagnosis: Right brain hemorrhage and infarcts. Labs and images (see above) independently reviewed.  Records reviewed and summated above.  1. Does the need for close, 24 hr/day medical supervision in concert with the patient's rehab needs make it unreasonable for this patient to be served in a less intensive setting? Yes  2. Co-Morbidities requiring supervision/potential complications: diabetes mellitus with hyperglycemia (Monitor in accordance with exercise and adjust meds as necessary), history of CVA without residual deficits, ABLA (repeat labs, consider transfusion if necessary to ensure appropriate perfusion for increased activity tolerance), post stroke dysphagia (advance diet as tolerated) 3. Due to bladder management, bowel management, safety, skin/wound care, disease management, medication administration and patient education, does the patient require 24 hr/day rehab nursing? Yes  4. Does the patient require coordinated care of a physician, rehab nurse, therapy disciplines of PT/OT/SLP to address physical and functional deficits in the context of the above medical diagnosis(es)? Yes Addressing deficits in the following areas: balance, endurance, locomotion, strength, transferring, bathing, dressing, toileting, cognition, speech, language, swallowing and psychosocial support 5. Can the patient actively participate in an intensive therapy program of at least 3 hrs of therapy per day at least 5 days per week? Potentially 6. The potential for patient to make measurable gains while on inpatient rehab is  excellent 7. Anticipated functional outcomes upon discharge from inpatient rehab are min assist  with PT, min assist with OT, supervision and min assist with SLP. 8. Estimated rehab length of stay to reach the above functional goals is: 17-20 days. 9. Anticipated discharge destination: Other 10. Overall Rehab/Functional Prognosis: good  RECOMMENDATIONS: This patient's condition is appropriate for continued rehabilitative care in the following setting: We will need to reevaluate once more alert & participatory, however will likely benefit from CIR.  Grandson, however, states that patient has communicated in her living will after her first stroke that should this situation arise, patient desires to go [place] [facility] [place].  Patient's daughter, who is in the area also has medical issues that limit ability to provide assistance. Patient has agreed to participate in recommended program. Potentially Note that insurance prior authorization may be required for reimbursement for recommended care.  Comment: Rehab Admissions Coordinator to follow up.  I have personally performed a face to face diagnostic evaluation, including, but not limited to relevant history and physical exam findings, of this patient and developed relevant assessment and plan.  Additionally, I have reviewed and concur with the physician assistant's documentation above.   Maryla MorrowAnkit Labrittany Wechter, MD, ABPMR Mcarthur Rossettianiel J Angiulli, PA-C 08/01/2019

## 2019-08-01 NOTE — Progress Notes (Signed)
Rehab Admissions Coordinator Note:  Patient was screened by Cleatrice Burke for appropriateness for an Inpatient Acute Rehab Consult per OT and SLP recs.   At this time, we are recommending Inpatient Rehab consult. I will place order.   Cleatrice Burke RN MSN 08/01/2019, 1:30 PM  I can be reached at [phone].

## 2019-08-01 NOTE — Progress Notes (Signed)
Arterial line very positional around 1700.  RN has tried to zero line but currently not getting accurate blood pressures from arterial line.  MD page, awaiting call back.  RN will monitor blood pressure via cuff.

## 2019-08-01 NOTE — Evaluation (Signed)
Physical Therapy Evaluation Patient Details Name: Veronica Holland MRN: [id number] DOB: [birth year] Today's Date: 08/01/2019   History of Present Illness  85 yo female s/p IR revascularization of R MCA with CT (+) R MCA occlusion PMH :  stroke, DM  Clinical Impression  Pt admitted with/for stroke with revascularization.  Pt needing max or greater assist for mobility on evaluation.   Pt currently limited functionally due to the problems listed. ( See problems list.)   Pt will benefit from PT to maximize function and safety in order to get ready for next venue listed below.     Follow Up Recommendations CIR;Supervision/Assistance - 24 hour    Equipment Recommendations  Other (comment)(TBA)    Recommendations for Other Services Rehab consult     Precautions / Restrictions Precautions Precautions: Fall      Mobility  Bed Mobility Overal bed mobility: Needs Assistance Bed Mobility: Rolling;Supine to Sit;Sit to Supine Rolling: Mod assist   Supine to sit: Max assist Sit to supine: Max assist   General bed mobility comments: requires (A) with bil LE off EOB. unable to push up from bed surface with L UE. unsteady at EOB needing (A) to sustain  Transfers                 General transfer comment: not appropriate at this time  Ambulation/Gait             General Gait Details: NT  Stairs            Wheelchair Mobility    Modified Rankin (Stroke Patients Only) Modified Rankin (Stroke Patients Only) Pre-Morbid Rankin Score: No symptoms Modified Rankin: Moderately severe disability     Balance Overall balance assessment: Needs assistance Sitting-balance support: Feet supported;Single extremity supported;No upper extremity supported Sitting balance-Leahy Scale: Poor(but starting to be able to move into midline.) Sitting balance - Comments: truncal reaction without significant control, fades off to the L, .                                      Pertinent Vitals/Pain Pain Assessment: No/denies pain    Home Living Family/patient expects to be discharged to:: Inpatient rehab Living Arrangements: Alone Available Help at Discharge: Family;Available PRN/intermittently Type of Home: House Home Access: Stairs to enter Entrance Stairs-Rails: None Entrance Stairs-Number of Steps: 1-3 Home Layout: One level Home Equipment: None Additional Comments: daughter calls daily at 6pm , drives    Prior Function Level of Independence: Independent               Hand Dominance   Dominant Hand: Right    Extremity/Trunk Assessment   Upper Extremity Assessment Upper Extremity Assessment: LUE deficits/detail LUE Deficits / Details: able to abduct shoudler, decrease shoulder flexion and sustaining , able to complete wrist flexion/ extension. able to move digits,  elbow flexion present.  LUE Sensation: WNL LUE Coordination: decreased fine motor;decreased gross motor    Lower Extremity Assessment Lower Extremity Assessment: LLE deficits/detail LLE Deficits / Details: moves leg against gravity and strength grossly 4-, df 0-1/5 and pf 2/5 LLE Sensation: WNL LLE Coordination: decreased fine motor    Cervical / Trunk Assessment Cervical / Trunk Assessment: Normal  Communication   Communication: Other (comment)(some slurred speech from previous CVA)  Cognition Arousal/Alertness: Awake/alert Behavior During Therapy: WFL for tasks assessed/performed Overall Cognitive Status: Within Functional Limits for tasks assessed  General Comments General comments (skin integrity, edema, etc.): HR 30-48, RR > 94 % RA, BP stable 118/65 in sitting Bradycardia    Exercises Other Exercises Other Exercises: Warm up ROM exercise prior to mobility   Assessment/Plan    PT Assessment Patient needs continued PT services  PT Problem List Decreased strength;Decreased activity  tolerance;Decreased mobility;Decreased coordination;Decreased balance       PT Treatment Interventions Gait training;Functional mobility training;Therapeutic activities;Balance training;Neuromuscular re-education;Patient/family education    PT Goals (Current goals can be found in the Care Plan section)  Acute Rehab PT Goals Patient Stated Goal: to get rehab PT Goal Formulation: With patient Time For Goal Achievement: 08/15/19 Potential to Achieve Goals: Good    Frequency Min 4X/week   Barriers to discharge        Co-evaluation PT/OT/SLP Co-Evaluation/Treatment: Yes Reason for Co-Treatment: For patient/therapist safety PT goals addressed during session: Mobility/safety with mobility OT goals addressed during session: ADL's and self-care       AM-PAC PT "6 Clicks" Mobility  Outcome Measure Help needed turning from your back to your side while in a flat bed without using bedrails?: A Lot Help needed moving from lying on your back to sitting on the side of a flat bed without using bedrails?: Total Help needed moving to and from a bed to a chair (including a wheelchair)?: Total Help needed standing up from a chair using your arms (e.g., wheelchair or bedside chair)?: Total Help needed to walk in hospital room?: Total Help needed climbing 3-5 steps with a railing? : Total 6 Click Score: 7    End of Session   Activity Tolerance: Patient tolerated treatment well Patient left: in bed;with call bell/phone within reach;with bed alarm set;with SCD's reapplied Nurse Communication: Mobility status PT Visit Diagnosis: Hemiplegia and hemiparesis;Other symptoms and signs involving the nervous system (R29.898);Other abnormalities of gait and mobility (R26.89) Hemiplegia - Right/Left: Left Hemiplegia - dominant/non-dominant: Non-dominant Hemiplegia - caused by: Cerebral infarction    Time: 1133-1203 PT Time Calculation (min) (ACUTE ONLY): 30 min   Charges:   PT Evaluation $PT Eval  Moderate Complexity: 1 Mod          08/01/2019  Veronica Carne., PT Acute Rehabilitation Services [phone]  (pager) [phone]  (office)  Veronica Holland 08/01/2019, 5:30 PM

## 2019-08-01 NOTE — Evaluation (Signed)
Speech Language Pathology Evaluation Patient Details Name: Veronica Holland MRN: [id number] DOB: [birth year] Today's Date: 08/01/2019 Time: 4098-1191 SLP Time Calculation (min) (ACUTE ONLY): 20 min  Problem List:  Patient Active Problem List   Diagnosis Date Noted  . 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 Acute left-sided weakness 07/31/2019  . Stroke (cerebrum) ([place]) 07/31/2019   Past Medical History:  Past Medical History:  Diagnosis Date  . Diabetes mellitus without complication ([place])   . [facility]    Past Surgical History:  Past Surgical History:  Procedure Laterality Date  . HYSTERECTOMY ABDOMINAL WITH SALPINGECTOMY     HPI:  85yo female admitted 07/31/2019 with left weakness, right gaze preference, dysarthria.  PMH: CVA (04/2019) DM2, HTN, PAFib, CKD3, HLD, diverticulosis, OA, peripheral neuropathy. HeadCT = RMCA, early infarct R lateral basal ganglia. Intubated for IR. MRI pending   Assessment / Plan / Recommendation Clinical Impression  The Mini-Mental State Examination (MMSE) was administered. Pt scored 26/30, indicating cognitive linguistic function is [facility]. Pt exhibits marked left side facial weakness and mild-moderate dysarthria. Pt would benefit from continued ST intervention targeting goals for increased oral motor strength and for education regarding strategies to maximize intelligibility of speech. Further, higher level cognitive linguistic assessment may be beneficial due to level of independence prior to admission. ST will follow acutely.    SLP Assessment  SLP Recommendation/Assessment: Patient needs continued Speech Language Pathology Services SLP Visit Diagnosis: Dysarthria and anarthria (R47.1)    Follow Up Recommendations  Inpatient Rehab    Frequency and Duration min 2x/week  2 weeks      SLP Evaluation Cognition  Overall Cognitive Status: No family/caregiver present to determine baseline cognitive functioning Orientation Level: Oriented  X4       Comprehension  Auditory Comprehension Overall Auditory Comprehension: Appears within functional limits for tasks assessed    Expression Expression Primary Mode of Expression: Verbal Verbal Expression Overall Verbal Expression: Appears within functional limits for tasks assessed   Oral / Motor  Oral Motor/Sensory Function Overall Oral Motor/Sensory Function: Moderate impairment Facial ROM: Reduced left Facial Symmetry: Abnormal symmetry left Facial Strength: Reduced left Facial Sensation: Within Functional Limits Lingual ROM: Within Functional Limits Lingual Symmetry: Within Functional Limits Lingual Strength: Within Functional Limits Lingual Sensation: Within Functional Limits Velum: Within Functional Limits Mandible: Within Functional Limits Motor Speech Overall Motor Speech: Impaired Respiration: Within functional limits Phonation: Low vocal intensity Resonance: Within functional limits Articulation: Impaired Level of Impairment: Sentence Intelligibility: Intelligibility reduced Word: 75-100% accurate Phrase: 75-100% accurate Sentence: 50-74% accurate Conversation: 50-74% accurate Motor Planning: Witnin functional limits Motor Speech Errors: Aware;Consistent Effective Techniques: Slow rate;Over-articulate   [place], [place], [place] Speech Language Pathologist Office: [phone] Pager: [phone]  Shonna Chock 08/01/2019, 11:18 AM

## 2019-08-01 NOTE — Sedation Documentation (Signed)
Groin and pulses assessed at bedside. Unchanged, see flowsheet.

## 2019-08-01 NOTE — Transfer of Care (Signed)
Immediate Anesthesia Transfer of Care Note  Patient: Veronica Holland  Procedure(s) Performed: IR WITH ANESTHESIA (N/A )  Patient Location: ICU , recovered via PACU team  Anesthesia Type:General  Level of Consciousness: drowsy, patient cooperative and responds to stimulation  Airway & Oxygen Therapy: Patient Spontanous Breathing and Patient connected to nasal cannula oxygen  Post-op Assessment: Report given to RN and Post -op Vital signs reviewed and stable  Post vital signs: Reviewed and stable  Last Vitals:  Vitals Value Taken Time  BP 126/80 08/01/19 0031  Temp    Pulse 82 08/01/19 0041  Resp 14 08/01/19 0041  SpO2 99 % 08/01/19 0041  Vitals shown include unvalidated device data.  Last Pain:  Vitals:   07/31/19 2158  TempSrc: Oral  PainSc: 0-No pain         Complications: No apparent anesthesia complications

## 2019-08-01 NOTE — Progress Notes (Addendum)
STROKE TEAM PROGRESS NOTE   HISTORY OF PRESENT ILLNESS (per record) Veronica Holland is an 85 y.o. female with recent history of stroke in Sep for which she received tPA and completely resolved. She was found sitting in a chair at home densely weak on the left side with an apparent unawareness of the deficit. Apparently had just eaten and had been doing a crossword puzzle - this along with her glasses was found on the floor. She takes ASA and Plavix at home. Recently noted Afib on her out pt work up. INTERVAL HISTORY Her RN is at the bedside. She slowly follows commands, improving.  I have personally reviewed history of presenting illness, electronic medical records and imaging films in PACS.  Patient has a remote history of a flutter and was recently noted to have paroxysmal A. fib on 30-day heart monitor but not started on anticoagulation.  She presented with right M1 occlusion and was treated with IV TPA followed by successful mechanical thrombectomy.  She remains with right gaze preference left hemiplegia and hemineglect blood pressure has been adequately controlled  OBJECTIVE Vitals:   08/01/19 1100 08/01/19 1200 08/01/19 1300 08/01/19 1500  BP: 123/64 118/65 (!) 113/54 (!) 109/43  Pulse: 64 70 62 65  Resp: (!) Temp:  99.4 F (37.4 C)    TempSrc:  Axillary    SpO2: 98% 98% 95% 96%  Weight:      Height:        CBC:  Recent Labs  Lab 07/31/19 2133 08/01/19 0222  WBC 8.5 9.7  NEUTROABS 7.8* 8.4*  HGB 12.6  13.3 11.4*  HCT 39.3  39.0 34.5*  MCV 94.2 92.2  PLT 229 223    Basic Metabolic Panel:  Recent Labs  Lab 07/31/19 2133 08/01/19 0222  NA 134*  137 135  K 4.4  4.4 3.9  CL 103  104 102  CO2 21* 22  GLUCOSE 292*  282* 287*  BUN 24*  23 17  CREATININE 1.03*  0.90 1.01*  CALCIUM 8.9 8.4*    Lipid Panel:     Component Value Date/Time   CHOL 99 08/01/2019 0355   TRIG 34 08/01/2019 0355   HDL 42 08/01/2019 0355   CHOLHDL 2.4 08/01/2019 0355   VLDL  7 08/01/2019 0355   LDLCALC 50 08/01/2019 0355   HgbA1c:  Lab Results  Component Value Date   HGBA1C 6.9 (H) 08/01/2019   Urine Drug Screen:     Component Value Date/Time   LABOPIA NONE DETECTED 07/31/2019 2227   COCAINSCRNUR NONE DETECTED 07/31/2019 2227   LABBENZ NONE DETECTED 07/31/2019 2227   AMPHETMU NONE DETECTED 07/31/2019 2227   THCU NONE DETECTED 07/31/2019 2227   LABBARB NONE DETECTED 07/31/2019 2227    Alcohol Level     Component Value Date/Time   ETH <10 08/01/2019 0537    IMAGING   CT Code Stroke CTA Head W/WO contrast  Result Date: 07/31/2019 CLINICAL DATA:  Stroke.  Left-sided weakness EXAM: CT ANGIOGRAPHY HEAD AND NECK CT PERFUSION BRAIN TECHNIQUE: Multidetector CT imaging of the head and neck was performed using the standard protocol during bolus administration of intravenous contrast. Multiplanar CT image reconstructions and MIPs were obtained to evaluate the vascular anatomy. Carotid stenosis measurements (when applicable) are obtained utilizing NASCET criteria, using the distal internal carotid diameter as the denominator. Multiphase CT imaging of the brain was performed following IV bolus contrast injection. Subsequent parametric perfusion maps were calculated using RAPID software. CONTRAST:  OMNIPAQUE IOHEXOL 350 MG/ML SOLN 100 mL Isovue 370 IV COMPARISON:  CT head 07/31/2019 FINDINGS: Aortic arch: Standard branching. Imaged portion shows no evidence of aneurysm or dissection. No significant stenosis of the major arch vessel origins. Atherosclerotic disease in the aortic arch. Atherosclerotic plaque proximal left subclavian artery with approximately 50% diameter stenosis proximal to the vertebral artery. Right carotid system: Atherosclerotic calcification at the right carotid bifurcation without significant stenosis. Left carotid system: Atherosclerotic calcification left carotid bifurcation. Approximately 50% diameter stenosis proximal left internal carotid  artery. Vertebral arteries: Right vertebral artery dominant. Both vertebral arteries patent to the basilar. Mild calcific stenosis at the origin of the vertebral bilaterally. Skeleton: Cervical spondylosis.  No acute skeletal abnormality. Other neck: 7 x 12 mm right thyroid nodule. No adenopathy in the neck. Upper chest: Lung apices clear bilaterally. Review of the MIP images confirms the above findings CTA HEAD FINDINGS Anterior circulation: Atherosclerotic calcification right cavernous carotid which is patent without significant stenosis. Thrombus in the terminal right internal carotid artery extending into the right M1 and proximal right A1 segments. There is flow distal to the right M1 occlusion with decreased enhancement of right MCA branches. Right anterior cerebral artery patent supplied from the left Left cavernous carotid without significant stenosis. Diffuse atherosclerotic calcification. Left anterior and middle cerebral arteries patent bilaterally without stenosis. Posterior circulation: Both vertebral arteries patent to the basilar. PICA patent bilaterally. Basilar widely patent. Superior cerebellar and posterior cerebral arteries patent bilaterally without significant stenosis or occlusion. Venous sinuses: Limited venous contrast due to our field of arterial phase scanning. Anatomic variants: None CT Brain Perfusion Findings: ASPECTS: 9 CBF (<30%) Volume: 8mL Perfusion (Tmax>6.0s) volume: Mismatch Volume: 95mL Infarction Location:Core infarct right basal ganglia. Penumbra in the right temporal and parietal lobe in the right MCA territory. IMPRESSION: 1. 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 There is collateral circulation with decreased perfusion in the right MCA territory. 2. CT perfusion demonstrates 8 mL of core infarct in the right basal ganglia. 95 mL of penumbra in the right temporoparietal lobe. 3. 50% diameter stenosis  proximal left subclavian artery. 4. Right carotid atherosclerotic disease without significant stenosis. 50% diameter stenosis proximal left internal carotid artery. 5. These results were called by telephone at the time of interpretation on 07/31/2019 at 10:11 pm to provider ERIC [facility] , who verbally acknowledged these results. Electronically Signed   By: Marlan Palau M.D.   On: 07/31/2019 22:12   CT Code Stroke CTA Neck W/WO contrast  Result Date: 07/31/2019 CLINICAL DATA:  Stroke.  Left-sided weakness EXAM: CT ANGIOGRAPHY HEAD AND NECK CT PERFUSION BRAIN TECHNIQUE: Multidetector CT imaging of the head and neck was performed using the standard protocol during bolus administration of intravenous contrast. Multiplanar CT image reconstructions and MIPs were obtained to evaluate the vascular anatomy. Carotid stenosis measurements (when applicable) are obtained utilizing NASCET criteria, using the distal internal carotid diameter as the denominator. Multiphase CT imaging of the brain was performed following IV bolus contrast injection. Subsequent parametric perfusion maps were calculated using RAPID software. CONTRAST:  OMNIPAQUE IOHEXOL 350 MG/ML SOLN 100 mL Isovue 370 IV COMPARISON:  CT head 07/31/2019 FINDINGS: Aortic arch: Standard branching. Imaged portion shows no evidence of aneurysm or dissection. No significant stenosis of the major arch vessel origins. Atherosclerotic disease in the aortic arch. Atherosclerotic plaque proximal left subclavian artery with approximately 50% diameter stenosis proximal to the vertebral artery. Right carotid system:  Atherosclerotic calcification at the right carotid bifurcation without significant stenosis. Left carotid system: Atherosclerotic calcification left carotid bifurcation. Approximately 50% diameter stenosis proximal left internal carotid artery. Vertebral arteries: Right vertebral artery dominant. Both vertebral arteries patent to the basilar. Mild calcific  stenosis at the origin of the vertebral bilaterally. Skeleton: Cervical spondylosis.  No acute skeletal abnormality. Other neck: 7 x 12 mm right thyroid nodule. No adenopathy in the neck. Upper chest: Lung apices clear bilaterally. Review of the MIP images confirms the above findings CTA HEAD FINDINGS Anterior circulation: Atherosclerotic calcification right cavernous carotid which is patent without significant stenosis. Thrombus in the terminal right internal carotid artery extending into the right M1 and proximal right A1 segments. There is flow distal to the right M1 occlusion with decreased enhancement of right MCA branches. Right anterior cerebral artery patent supplied from the left Left cavernous carotid without significant stenosis. Diffuse atherosclerotic calcification. Left anterior and middle cerebral arteries patent bilaterally without stenosis. Posterior circulation: Both vertebral arteries patent to the basilar. PICA patent bilaterally. Basilar widely patent. Superior cerebellar and posterior cerebral arteries patent bilaterally without significant stenosis or occlusion. Venous sinuses: Limited venous contrast due to our field of arterial phase scanning. Anatomic variants: None CT Brain Perfusion Findings: ASPECTS: 9 CBF (<30%) Volume: 8mL Perfusion (Tmax>6.0s) volume: Mismatch Volume: 95mL Infarction Location:Core infarct right basal ganglia. Penumbra in the right temporal and parietal lobe in the right MCA territory. IMPRESSION: 1. 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 There is collateral circulation with decreased perfusion in the right MCA territory. 2. CT perfusion demonstrates 8 mL of core infarct in the right basal ganglia. 95 mL of penumbra in the right temporoparietal lobe. 3. 50% diameter stenosis proximal left subclavian artery. 4. Right carotid atherosclerotic disease without significant stenosis. 50% diameter  stenosis proximal left internal carotid artery. 5. These results were called by telephone at the time of interpretation on 07/31/2019 at 10:11 pm to provider ERIC [facility] , who verbally acknowledged these results. Electronically Signed   By: Marlan Palau M.D.   On: 07/31/2019 22:12   MR BRAIN WO CONTRAST  Result Date: 08/01/2019 CLINICAL DATA:  Stroke.  Endovascular thrombectomy 07/31/2019 EXAM: MRI HEAD WITHOUT CONTRAST TECHNIQUE: Multiplanar, multiecho pulse sequences of the brain and surrounding structures were obtained without intravenous contrast. COMPARISON:  CT a head and neck 07/31/2019 FINDINGS: Brain: Acute hemorrhagic infarction in the right caudate and right putamen. Small amount of acute infarct in the anterior limb internal capsule on the right. Small acute infarcts in the right frontal white matter and right occipital lobe. 2 cm acute infarct in the right medial temporal lobe. Ventricle size normal. No midline shift. Mild mass-effect on the right lateral ventricle due to acute infarcts in the right basal ganglia. Mild chronic ischemic change in the white matter and pons. Chronic hemorrhage in the left frontal lobe. Vascular: Normal arterial flow voids.  Normal flow void right MCA. Skull and upper cervical spine: No focal skeletal lesion. Sinuses/Orbits: Mild mucosal edema paranasal sinuses. Bilateral cataract surgery Other: None IMPRESSION: Acute hemorrhagic infarction in the right putamen and right caudate. Acute infarct extends into the right temporal lobe. Small areas of acute infarct right frontal and right occipital lobe Mild chronic microvascular ischemic change in the white matter. Electronically Signed   By: Marlan Palau M.D.   On: 08/01/2019 14:28   CT Code Stroke Cerebral Perfusion with contrast  Result Date: 07/31/2019 CLINICAL DATA:  Stroke.  Left-sided weakness EXAM: CT ANGIOGRAPHY HEAD AND NECK CT PERFUSION BRAIN TECHNIQUE: Multidetector CT imaging of the head and neck was  performed using the standard protocol during bolus administration of intravenous contrast. Multiplanar CT image reconstructions and MIPs were obtained to evaluate the vascular anatomy. Carotid stenosis measurements (when applicable) are obtained utilizing NASCET criteria, using the distal internal carotid diameter as the denominator. Multiphase CT imaging of the brain was performed following IV bolus contrast injection. Subsequent parametric perfusion maps were calculated using RAPID software. CONTRAST:  112mL OMNIPAQUE IOHEXOL 350 MG/ML SOLN 100 mL Isovue 370 IV COMPARISON:  CT head 07/31/2019 FINDINGS: Aortic arch: Standard branching. Imaged portion shows no evidence of aneurysm or dissection. No significant stenosis of the major arch vessel origins. Atherosclerotic disease in the aortic arch. Atherosclerotic plaque proximal left subclavian artery with approximately 50% diameter stenosis proximal to the vertebral artery. Right carotid system: Atherosclerotic calcification at the right carotid bifurcation without significant stenosis. Left carotid system: Atherosclerotic calcification left carotid bifurcation. Approximately 50% diameter stenosis proximal left internal carotid artery. Vertebral arteries: Right vertebral artery dominant. Both vertebral arteries patent to the basilar. Mild calcific stenosis at the origin of the vertebral bilaterally. Skeleton: Cervical spondylosis.  No acute skeletal abnormality. Other neck: 7 x 12 mm right thyroid nodule. No adenopathy in the neck. Upper chest: Lung apices clear bilaterally. Review of the MIP images confirms the above findings CTA HEAD FINDINGS Anterior circulation: Atherosclerotic calcification right cavernous carotid which is patent without significant stenosis. Thrombus in the terminal right internal carotid artery extending into the right M1 and proximal right A1 segments. There is flow distal to the right M1 occlusion with decreased enhancement of right MCA  branches. Right anterior cerebral artery patent supplied from the left Left cavernous carotid without significant stenosis. Diffuse atherosclerotic calcification. Left anterior and middle cerebral arteries patent bilaterally without stenosis. Posterior circulation: Both vertebral arteries patent to the basilar. PICA patent bilaterally. Basilar widely patent. Superior cerebellar and posterior cerebral arteries patent bilaterally without significant stenosis or occlusion. Venous sinuses: Limited venous contrast due to our field of arterial phase scanning. Anatomic variants: None CT Brain Perfusion Findings: ASPECTS: 9 CBF (<30%) Volume: 47mL Perfusion (Tmax>6.0s) volume: 154mL Mismatch Volume: 55mL Infarction Location:Core infarct right basal ganglia. Penumbra in the right temporal and parietal lobe in the right MCA territory. IMPRESSION: 1. 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 There is collateral circulation with decreased perfusion in the right MCA territory. 2. CT perfusion demonstrates 8 mL of core infarct in the right basal ganglia. 95 mL of penumbra in the right temporoparietal lobe. 3. 50% diameter stenosis proximal left subclavian artery. 4. Right carotid atherosclerotic disease without significant stenosis. 50% diameter stenosis proximal left internal carotid artery. 5. These results were called by telephone at the time of interpretation on 07/31/2019 at 10:11 pm to provider ERIC [facility] , who verbally acknowledged these results. Electronically Signed   By: Franchot Gallo M.D.   On: 07/31/2019 22:12   ECHOCARDIOGRAM COMPLETE  Result Date: 08/01/2019   ECHOCARDIOGRAM REPORT   Patient Name:   Veronica Holland Date of Exam: 08/01/2019 Medical Rec #:  [id number]    Height:       64.0 in Accession #:    [id number]   Weight:       149.9 lb Date of Birth:  01-10-1925     BSA:          1.73 m Patient  Age:    89 years     BP:           116/63 mmHg  Patient Gender: F            HR:           65 bpm. Exam Location:  Inpatient Procedure: 2D Echo, Cardiac Doppler, Color Doppler and Intracardiac            Opacification Agent Indications:    Stroke 434.91  History:        Patient has no prior history of Echocardiogram examinations.                 Stroke; Risk Factors:Non-Smoker.  Sonographer:    Tonia Ghent RDCS Referring Phys: [id number] ERIC LINDZEN IMPRESSIONS  1. Left ventricular ejection fraction, by visual estimation, is 55 to 60%. The left ventricle has normal function. There is no left ventricular hypertrophy.  2. Definity contrast agent was given IV to delineate the left ventricular endocardial borders.  3. Elevated left atrial pressure.  4. Left ventricular diastolic parameters are consistent with Grade II diastolic dysfunction (pseudonormalization).  5. Global right ventricle has normal systolic function.The right ventricular size is normal.  6. Left atrial size was normal.  7. Right atrial size was normal.  8. Mild mitral annular calcification.  9. The mitral valve is normal in structure. Trivial mitral valve regurgitation. No evidence of mitral stenosis. 10. The tricuspid valve is normal in structure. 11. The aortic valve is tricuspid. Aortic valve regurgitation is not visualized. Mild aortic valve sclerosis without stenosis. 12. The pulmonic valve was normal in structure. Pulmonic valve regurgitation is trivial. 13. The inferior vena cava is normal in size with greater than 50% respiratory variability, suggesting right atrial pressure of 3 mmHg. 14. Definity used; normal LV systolic function; grade 2 diastolic dysfunction. FINDINGS  Left Ventricle: Left ventricular ejection fraction, by visual estimation, is 55 to 60%. The left ventricle has normal function. Definity contrast agent was given IV to delineate the left ventricular endocardial borders. The left ventricle is not well visualized. There is no left ventricular hypertrophy. Left ventricular  diastolic parameters are consistent with Grade II diastolic dysfunction (pseudonormalization). Elevated left atrial pressure. Right Ventricle: The right ventricular size is normal.Global RV systolic function is has normal systolic function. Left Atrium: Left atrial size was normal in size. Right Atrium: Right atrial size was normal in size Pericardium: There is no evidence of pericardial effusion. Mitral Valve: The mitral valve is normal in structure. Mild mitral annular calcification. Trivial mitral valve regurgitation. No evidence of mitral valve stenosis by observation. Tricuspid Valve: The tricuspid valve is normal in structure. Tricuspid valve regurgitation is trivial. Aortic Valve: The aortic valve is tricuspid. Aortic valve regurgitation is not visualized. Mild aortic valve sclerosis is present, with no evidence of aortic valve stenosis. Pulmonic Valve: The pulmonic valve was normal in structure. Pulmonic valve regurgitation is trivial. Pulmonic regurgitation is trivial. Aorta: The aortic root is normal in size and structure. Venous: The inferior vena cava was not well visualized. The inferior vena cava is normal in size with greater than 50% respiratory variability, suggesting right atrial pressure of 3 mmHg.  Additional Comments: Definity used; normal LV systolic function; grade 2 diastolic dysfunction.  LEFT VENTRICLE PLAX 2D LVIDd:         4.52 cm       Diastology LVIDs:         3.08 cm       LV  e' lateral:   7.71 cm/s LV PW:         1.01 cm       LV E/e' lateral: 11.9 LV IVS:        1.00 cm       LV e' medial:    4.99 cm/s LVOT diam:     1.70 cm       LV E/e' medial:  18.4 LV SV:         56 ml LV SV Index:   31.76 LVOT Area:     2.27 cm  LV Volumes (MOD) LV area d, A2C:    28.90 cm LV area d, A4C:    29.00 cm LV area s, A2C:    16.60 cm LV area s, A4C:    17.90 cm LV major d, A2C:   7.84 cm LV major d, A4C:   7.49 cm LV major s, A2C:   6.31 cm LV major s, A4C:   6.49 cm LV vol d, MOD A2C: 88.8 ml LV  vol d, MOD A4C: 93.2 ml LV vol s, MOD A2C: 38.0 ml LV vol s, MOD A4C: 41.7 ml LV SV MOD A2C:     50.8 ml LV SV MOD A4C:     93.2 ml LV SV MOD BP:      52.8 ml RIGHT VENTRICLE RV S prime:     9.20 cm/s TAPSE (M-mode): 1.8 cm LEFT ATRIUM           Index       RIGHT ATRIUM           Index LA diam:      3.50 cm 2.02 cm/m  RA Area:     13.20 cm LA Vol (A2C): 43.7 ml 25.25 ml/m RA Volume:   27.10 ml  15.66 ml/m LA Vol (A4C): 39.4 ml 22.76 ml/m  AORTIC VALVE LVOT Vmax:   75.70 cm/s LVOT Vmean:  53.200 cm/s LVOT VTI:    0.179 m  AORTA Ao Root diam: 2.80 cm MITRAL VALVE MV Area (PHT): 3.12 cm             SHUNTS MV PHT:        70.47 msec           Systemic VTI:  0.18 m MV Decel Time: 243 msec             Systemic Diam: 1.70 cm MV E velocity: 91.70 cm/s 103 cm/s MV A velocity: 46.70 cm/s 70.3 cm/s MV E/A ratio:  1.96       1.5  Olga Millers MD Electronically signed by Olga Millers MD Signature Date/Time: 08/01/2019/11:51:31 AM    Final    CT HEAD CODE STROKE WO CONTRAST  Result Date: 07/31/2019 CLINICAL DATA:  Code stroke.  Left-sided weakness. Slurred speech. EXAM: CT HEAD WITHOUT CONTRAST TECHNIQUE: Contiguous axial images were obtained from the base of the skull through the vertex without intravenous contrast. COMPARISON:  CT head 05/18/2027 FINDINGS: Brain: Mild atrophy. Patchy white matter hypodensity bilaterally is unchanged. Mild asymmetric hypodensity right lateral basal ganglia. Insular cortex normal. Negative for hemorrhage. Vascular: Hyperdense right MCA. Skull: Negative Sinuses/Orbits: Paranasal sinuses clear.  Bilateral cataract surgery Other: None ASPECTS ([place] Stroke Program Early CT Score) - Ganglionic level infarction (caudate, lentiform nuclei, internal capsule, insula, M1-M3 cortex): 6 - Supraganglionic infarction (M4-M6 cortex): 3 Total score (0-10 with 10 being normal): 9 IMPRESSION: 1. Hyperdense right MCA compatible with acute thrombus. Probable early infarct right lateral basal ganglia  2. ASPECTS is 9 3. These results were called by telephone at the time of interpretation on 07/31/2019 at 9:41 pm to provider Dr.Lindzen , who verbally acknowledged these results. Electronically Signed   By: Marlan Palauharles  Clark M.D.   On: 07/31/2019 21:42   Transthoracic Echocardiogram  00/00/2020 Pending PHYSICAL EXAM Blood pressure (!) 109/43, pulse 65, temperature 99.4 F (37.4 C), temperature source Axillary, resp. rate 17, height 5\' 4"  (1.626 m), weight 68 kg, SpO2 96 %.  GEN: Frail elderly Caucasian lady no acute distress. Frail, elderly lady Head: normocephalic Resp: normal effort, no distress CV: no edema.  GI: soft, nontender  Neurologic Examination: Mental Status: Alert with left hemineglect.  Speech with intermittent dysfluency as well as dysarthria. Mild comprehension deficit. Able to follow all simple motor commands.  Cranial Nerves: II:  Left visual field cut. PERRL.  III,IV, VI: No ptosis. Rightward gaze deviation. Does attempt to cross midline to left.  V,VII: Left facial droop. Facial temp sensation intact but slower to respond on the left.  VIII: hearing intact to questions and commands.  IX,X: Pharyngeal dysarthria noted.  XI: Shoulder shrug decreased on the left XII: Leftward tongue deviation Motor: RUE 5/5 and RLE is gen weak with attempt to hold against gravity, 4-/5 LUE with increased tone, 2/5 proximally and distally LLE unable to elevate antigravity, drops to bed after passive elevation without any effort against gravity. There is a flicker in toes on command Sensory: Decreased responses to temp and FT on the left. Positive for extinction on the left.  Deep Tendon Reflexes:  Not tested Right toe downgoing, left toe upgoing.  Cerebellar: No gross ataxia  Gait: Unable to assess  HOME MEDICATIONS:  Medications Prior to Admission  Medication Sig Dispense Refill  . aspirin EC 81 MG tablet Take 81 mg by mouth daily.    . calcium-vitamin D (OSCAL WITH D) 500-200  MG-UNIT tablet Take 1 tablet by mouth daily with breakfast.    . clopidogrel (PLAVIX) 75 MG tablet Take 75 mg by mouth daily.    . insulin aspart (NOVOLOG) 100 UNIT/ML injection Inject 16 Units into the skin 3 (three) times daily before meals.    . insulin detemir (LEVEMIR) 100 UNIT/ML injection Inject 25 Units into the skin daily.    Marland Kitchen. losartan (COZAAR) 100 MG tablet Take 25 mg by mouth daily.        HOSPITAL MEDICATIONS:  .  stroke: mapping our early stages of recovery book   Does not apply Once  . amiodarone  200 mg Oral Daily  . atorvastatin  20 mg Oral q1800  . Chlorhexidine Gluconate Cloth  6 each Topical Daily  . insulin aspart  0-9 Units Subcutaneous Q4H  . latanoprost  1 drop Both Eyes QHS  . losartan  25 mg Oral Daily    ALLERGIES Not on File  ASSESSMENT/PLAN [name] is a 85 y.o. female with history of previous stroke in Sep with full recovery and resuming all activities of daily life (mRS 0). Now presenting with acute onset of left hemiplegia, left hemineglect, left facial droop, left hemianopsia, dysarthria and anosognosia, NIHSS 19. She was not able to get IV tPA d/t outside of time window, but [facility] with TICI 3 revascularization.  Stroke: Cardieoemboic RMCA stroke d/t new dx of Afib, not on OAC yet  Resultant  Left hemiplegia, neglect, dysarthria  Code Stroke CT Head -    ASPECTS 9  CT head - hyperdense R MCA  MRI head- pending  MRA head - n/a  CTA H&N - acute occlusion of terminal RICA extending to R M1/A1. 50% prox L subclavian artery stenosis noted. bilat carotid athero dz w/o significant stenosis.   CT Perfusion  Carotid Doppler - n/a  2D Echo - pending  Sars Corona Virus 2  neg  LDL - 50    Component Value Date/Time   LDLCALC 50 08/01/2019 0355     HgbA1c - 6.9  UDS neg  VTE prophylaxis - Scd's post thrombectomy Diet  Diet Order            DIET - DYS 1 Room service appropriate? Yes with Assist;  Fluid consistency: Nectar Thick  Diet effective now               ASA + Plavix prior to admission, none started yet post IA thrombectomy, but anticipate will need OAC for Afib  Patient will need to be counseled to be compliant with her antithrombotic medications  Ongoing aggressive stroke risk factor management  Therapy recommendations:  pending  Disposition:  Pending  Hypertension  Home BP meds: Cozaar  Current BP meds: none   Stable . Permissive hypertension (OK if < 180/110) but gradually normalize in 5-7 days . Long-term BP goal normotensive  Hyperlipidemia  Home Lipid lowering medication: none  LDL 50, goal < 70  Current lipid lowering medication: not indicated, below goal and advanced age  Diabetes  Home diabetic meds: Levemir, Novolog  Current diabetic meds: SSI   HgbA1c 6.9, goal < 7.0 Recent Labs    08/01/19 1219 08/01/19 1600 08/01/19 1602  GLUCAP 126* 15* 106*     Other Stroke Risk Factors  Advanced age  Hx stroke/TIA   Atrial fibrillation, new diagnosis as out pt with "Zio patch", no OAC started yet  Hospital day # 1  Desiree Metzger-Cihelka, ARNP-C, ANVP-BC Pager: [phone]  I have personally obtained history,examined this patient, reviewed notes, independently viewed imaging studies, participated in medical decision making and plan of care.ROS completed by me personally and pertinent positives fully documented  I have made any additions or clarifications directly to the above note. Agree with note above.  She presented with right M1 occlusion and was treated with IV TPA followed by successful mechanical thrombectomy.  Neurological exams suggest right gaze preference left hemineglect and dense left hemiplegia.  MRI scan shows hemorrhagic right basal ganglia and cortical infarct.  Will hold antiplatelet therapy with repeat CT scan without contrast tomorrow may consider starting aspirin.  She will eventually need anticoagulation but will  have to wait for at least 7 to 10 days hemorrhage resolves.  Maintain strict control of blood pressure with systolic goal below 140 for 24 hours and then below 160.  Close neurological monitoring as per post TPA protocol.  I had a long discussion with the patient's grandson at the bedside who is a Advice worker in [place] about her prognosis and answered questions.  They have a supportive family and want to take care of the patient at home and hope she qualifies to go to inpatient rehab. This patient is critically ill and at significant risk of neurological worsening, death and care requires constant monitoring of vital signs, hemodynamics,respiratory and cardiac monitoring, extensive review of multiple databases, frequent neurological assessment, discussion with family, other specialists and medical decision making of high complexity.I have made any additions or clarifications directly to the above note.This critical care time does not reflect procedure time, or teaching time or supervisory time  of PA/NP/Med Resident etc but could involve care discussion time.  I spent 30 minutes of neurocritical care time  in the care of  this patient.     Delia Heady, MD Medical Director [facility] Pager: [phone] 08/01/2019 8:03 PM  To contact Stroke Continuity provider, please refer to [email or web address]. After hours, contact General Neurology

## 2019-08-02 ENCOUNTER — Inpatient Hospital Stay (HOSPITAL_COMMUNITY): Payer: Medicare Other

## 2019-08-02 LAB — GLUCOSE, CAPILLARY
Glucose-Capillary: 110 mg/dL — ABNORMAL HIGH (ref 70–99)
Glucose-Capillary: 121 mg/dL — ABNORMAL HIGH (ref 70–99)
Glucose-Capillary: 134 mg/dL — ABNORMAL HIGH (ref 70–99)
Glucose-Capillary: 156 mg/dL — ABNORMAL HIGH (ref 70–99)
Glucose-Capillary: 161 mg/dL — ABNORMAL HIGH (ref 70–99)
Glucose-Capillary: 214 mg/dL — ABNORMAL HIGH (ref 70–99)
Glucose-Capillary: 234 mg/dL — ABNORMAL HIGH (ref 70–99)
Glucose-Capillary: 240 mg/dL — ABNORMAL HIGH (ref 70–99)

## 2019-08-02 MED ORDER — INSULIN ASPART 100 UNIT/ML ~~LOC~~ SOLN: 0.0000 [IU] | Freq: Three times a day (TID) | SUBCUTANEOUS | Status: DC

## 2019-08-02 MED ORDER — ASPIRIN EC 81 MG PO TBEC: 81.0000 mg | DELAYED_RELEASE_TABLET | Freq: Every day | ORAL | Status: DC

## 2019-08-02 MED ORDER — LOSARTAN POTASSIUM 50 MG PO TABS: 25.0000 mg | ORAL_TABLET | Freq: Every day | ORAL | Status: DC

## 2019-08-02 MED ORDER — INSULIN DETEMIR 100 UNIT/ML ~~LOC~~ SOLN
25.0000 [IU] | Freq: Every day | SUBCUTANEOUS | Status: DC
  Administered 2019-08-02 – 2019-08-08 (×7): 25 [IU] via SUBCUTANEOUS
  Filled 2019-08-02 (×8): qty 0.25

## 2019-08-02 MED ORDER — CALCIUM CARBONATE-VITAMIN D 500-200 MG-UNIT PO TABS
1.0000 | ORAL_TABLET | Freq: Every day | ORAL | Status: DC
  Administered 2019-08-03 – 2019-08-09 (×7): 1 via ORAL
  Filled 2019-08-02 (×7): qty 1

## 2019-08-02 MED ORDER — ASPIRIN EC 81 MG PO TBEC
81.0000 mg | DELAYED_RELEASE_TABLET | Freq: Every day | ORAL | Status: DC
  Administered 2019-08-03 – 2019-08-04 (×2): 81 mg via ORAL
  Filled 2019-08-02 (×2): qty 1

## 2019-08-02 NOTE — Plan of Care (Signed)
  Problem: Self-Care: Goal: Ability to participate in self-care as condition permits will improve Note: Pt needs set up with feedings but otherwise is able to feed her self with full supervision and redirection of small bites based on guidelines from swallowing and speech pathologist. Will continue to provide more support of self feeding under supervision.

## 2019-08-02 NOTE — Progress Notes (Signed)
IP rehab admissions - Patient asleep on rounds today.  Please see rehab consult done by Dr. Posey Pronto on 08/01/19. Noted patient may desire to go to [facility] in [facility]  Patient's daughter with limited assistance.  Will have my partner follow up with patient and daughter on Monday for plans.  [phone]

## 2019-08-02 NOTE — Progress Notes (Addendum)
STROKE TEAM PROGRESS NOTE     INTERVAL HISTORY Her RN is at the bedside. She slowly follows commands, improving.  She passed swallow eval and is now on a dysphagia 1 pured nectar thick liquid diet.  She continues to have left hemiparesis but neglect is much improved and she is able to move the left arm and leg against gravity but she is not able to hold it up CT head this morning shows stable appearance of the right hemorrhagic basal ganglia infarct without significant midline shift or mass-effect  OBJECTIVE Vitals:   08/02/19 0831 08/02/19 1100 08/02/19 1110 08/02/19 1510  BP: 128/62 133/62  (!) 117/55  Pulse: 100 (!) 131 84 78  Resp: (!) 25 19 (!) 24 (!) 23  Temp: 98.9 F (37.2 C) 100 F (37.8 C)  99.5 F (37.5 C)  TempSrc: Oral Oral  Axillary  SpO2: 94% 95% 95% 96%  Weight:      Height:        CBC:  Recent Labs  Lab 07/31/19 2133 08/01/19 0222  WBC 8.5 9.7  NEUTROABS 7.8* 8.4*  HGB 12.6  13.3 11.4*  HCT 39.3  39.0 34.5*  MCV 94.2 92.2  PLT 229 223    Basic Metabolic Panel:  Recent Labs  Lab 07/31/19 2133 08/01/19 0222  NA 134*  137 135  K 4.4  4.4 3.9  CL 103  104 102  CO2 21* 22  GLUCOSE 292*  282* 287*  BUN 24*  23 17  CREATININE 1.03*  0.90 1.01*  CALCIUM 8.9 8.4*    Lipid Panel:     Component Value Date/Time   CHOL 99 08/01/2019 0355   TRIG 34 08/01/2019 0355   HDL 42 08/01/2019 0355   CHOLHDL 2.4 08/01/2019 0355   VLDL 7 08/01/2019 0355   LDLCALC 50 08/01/2019 0355   HgbA1c:  Lab Results  Component Value Date   HGBA1C 6.9 (H) 08/01/2019   Urine Drug Screen:     Component Value Date/Time   LABOPIA NONE DETECTED 07/31/2019 2227   COCAINSCRNUR NONE DETECTED 07/31/2019 2227   LABBENZ NONE DETECTED 07/31/2019 2227   AMPHETMU NONE DETECTED 07/31/2019 2227   THCU NONE DETECTED 07/31/2019 2227   LABBARB NONE DETECTED 07/31/2019 2227    Alcohol Level     Component Value Date/Time   ETH <10 08/01/2019 0537    IMAGING   CT  Code Stroke CTA Head W/WO contrast  Result Date: 07/31/2019 CLINICAL DATA:  Stroke.  Left-sided weakness EXAM: CT ANGIOGRAPHY HEAD AND NECK CT PERFUSION BRAIN TECHNIQUE: Multidetector CT imaging of the head and neck was performed using the standard protocol during bolus administration of intravenous contrast. Multiplanar CT image reconstructions and MIPs were obtained to evaluate the vascular anatomy. Carotid stenosis measurements (when applicable) are obtained utilizing NASCET criteria, using the distal internal carotid diameter as the denominator. Multiphase CT imaging of the brain was performed following IV bolus contrast injection. Subsequent parametric perfusion maps were calculated using RAPID software. CONTRAST:  OMNIPAQUE IOHEXOL 350 MG/ML SOLN 100 mL Isovue 370 IV COMPARISON:  CT head 07/31/2019 FINDINGS: Aortic arch: Standard branching. Imaged portion shows no evidence of aneurysm or dissection. No significant stenosis of the major arch vessel origins. Atherosclerotic disease in the aortic arch. Atherosclerotic plaque proximal left subclavian artery with approximately 50% diameter stenosis proximal to the vertebral artery. Right carotid system: Atherosclerotic calcification at the right carotid bifurcation without significant stenosis. Left carotid system: Atherosclerotic calcification left carotid bifurcation. Approximately 50% diameter stenosis  proximal left internal carotid artery. Vertebral arteries: Right vertebral artery dominant. Both vertebral arteries patent to the basilar. Mild calcific stenosis at the origin of the vertebral bilaterally. Skeleton: Cervical spondylosis.  No acute skeletal abnormality. Other neck: 7 x 12 mm right thyroid nodule. No adenopathy in the neck. Upper chest: Lung apices clear bilaterally. Review of the MIP images confirms the above findings CTA HEAD FINDINGS Anterior circulation: Atherosclerotic calcification right cavernous carotid which is patent without  significant stenosis. Thrombus in the terminal right internal carotid artery extending into the right M1 and proximal right A1 segments. There is flow distal to the right M1 occlusion with decreased enhancement of right MCA branches. Right anterior cerebral artery patent supplied from the left Left cavernous carotid without significant stenosis. Diffuse atherosclerotic calcification. Left anterior and middle cerebral arteries patent bilaterally without stenosis. Posterior circulation: Both vertebral arteries patent to the basilar. PICA patent bilaterally. Basilar widely patent. Superior cerebellar and posterior cerebral arteries patent bilaterally without significant stenosis or occlusion. Venous sinuses: Limited venous contrast due to our field of arterial phase scanning. Anatomic variants: None CT Brain Perfusion Findings: ASPECTS: 9 CBF (<30%) Volume: 49mL Perfusion (Tmax>6.0s) volume: Mismatch Volume: 63mL Infarction Location:Core infarct right basal ganglia. Penumbra in the right temporal and parietal lobe in the right MCA territory. IMPRESSION: 1. 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 There is collateral circulation with decreased perfusion in the right MCA territory. 2. CT perfusion demonstrates 8 mL of core infarct in the right basal ganglia. 95 mL of penumbra in the right temporoparietal lobe. 3. 50% diameter stenosis proximal left subclavian artery. 4. Right carotid atherosclerotic disease without significant stenosis. 50% diameter stenosis proximal left internal carotid artery. 5. These results were called by telephone at the time of interpretation on 07/31/2019 at 10:11 pm to provider ERIC [facility] , who verbally acknowledged these results. Electronically Signed   By: Marlan Palau M.D.   On: 07/31/2019 22:12   CT HEAD WO CONTRAST  Result Date: 08/02/2019 CLINICAL DATA:  Right MCA occlusion post tPA and thrombectomy EXAM: CT  HEAD WITHOUT CONTRAST TECHNIQUE: Contiguous axial images were obtained from the base of the skull through the vertex without intravenous contrast. COMPARISON:  07/31/2019 FINDINGS: Brain: There is hyperdensity with surrounding hypoattenuation of the right lentiform and caudate nuclei and adjacent white matter compatible with evolving acute infarction with superimposed contrast staining or reperfusion hemorrhage. There is partial effacement of the right lateral ventricle with trace leftward midline shift. There is no evidence of trapping. Additional hypoattenuation is present in the anterior right temporal lobe reflecting additional evolving infarction. Patchy hypoattenuation in the supratentorial white matter likely reflects superimposed chronic microvascular ischemic changes. Vascular: Resolution of right MCA hyperdensity. Skull: Calvarium is unremarkable. Sinuses/Orbits: No acute finding. Other: None. IMPRESSION: Evolving acute infarction of the right basal ganglia and adjacent white matter with hyperdensity reflecting contrast staining and/or hemorrhage. Additional evolving acute infarction of the anterior right temporal lobe. Mild mass effect is present. Electronically Signed   By: Guadlupe Spanish M.D.   On: 08/02/2019 09:46   CT Code Stroke CTA Neck W/WO contrast  Result Date: 07/31/2019 CLINICAL DATA:  Stroke.  Left-sided weakness EXAM: CT ANGIOGRAPHY HEAD AND NECK CT PERFUSION BRAIN TECHNIQUE: Multidetector CT imaging of the head and neck was performed using the standard protocol during bolus administration of intravenous contrast. Multiplanar CT image reconstructions and MIPs were obtained to evaluate the vascular anatomy. Carotid stenosis measurements (  when applicable) are obtained utilizing NASCET criteria, using the distal internal carotid diameter as the denominator. Multiphase CT imaging of the brain was performed following IV bolus contrast injection. Subsequent parametric perfusion maps were  calculated using RAPID software. CONTRAST:  OMNIPAQUE IOHEXOL 350 MG/ML SOLN 100 mL Isovue 370 IV COMPARISON:  CT head 07/31/2019 FINDINGS: Aortic arch: Standard branching. Imaged portion shows no evidence of aneurysm or dissection. No significant stenosis of the major arch vessel origins. Atherosclerotic disease in the aortic arch. Atherosclerotic plaque proximal left subclavian artery with approximately 50% diameter stenosis proximal to the vertebral artery. Right carotid system: Atherosclerotic calcification at the right carotid bifurcation without significant stenosis. Left carotid system: Atherosclerotic calcification left carotid bifurcation. Approximately 50% diameter stenosis proximal left internal carotid artery. Vertebral arteries: Right vertebral artery dominant. Both vertebral arteries patent to the basilar. Mild calcific stenosis at the origin of the vertebral bilaterally. Skeleton: Cervical spondylosis.  No acute skeletal abnormality. Other neck: 7 x 12 mm right thyroid nodule. No adenopathy in the neck. Upper chest: Lung apices clear bilaterally. Review of the MIP images confirms the above findings CTA HEAD FINDINGS Anterior circulation: Atherosclerotic calcification right cavernous carotid which is patent without significant stenosis. Thrombus in the terminal right internal carotid artery extending into the right M1 and proximal right A1 segments. There is flow distal to the right M1 occlusion with decreased enhancement of right MCA branches. Right anterior cerebral artery patent supplied from the left Left cavernous carotid without significant stenosis. Diffuse atherosclerotic calcification. Left anterior and middle cerebral arteries patent bilaterally without stenosis. Posterior circulation: Both vertebral arteries patent to the basilar. PICA patent bilaterally. Basilar widely patent. Superior cerebellar and posterior cerebral arteries patent bilaterally without significant stenosis or  occlusion. Venous sinuses: Limited venous contrast due to our field of arterial phase scanning. Anatomic variants: None CT Brain Perfusion Findings: ASPECTS: 9 CBF (<30%) Volume: 8mL Perfusion (Tmax>6.0s) volume: Mismatch Volume: 95mL Infarction Location:Core infarct right basal ganglia. Penumbra in the right temporal and parietal lobe in the right MCA territory. IMPRESSION: 1. 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 There is collateral circulation with decreased perfusion in the right MCA territory. 2. CT perfusion demonstrates 8 mL of core infarct in the right basal ganglia. 95 mL of penumbra in the right temporoparietal lobe. 3. 50% diameter stenosis proximal left subclavian artery. 4. Right carotid atherosclerotic disease without significant stenosis. 50% diameter stenosis proximal left internal carotid artery. 5. These results were called by telephone at the time of interpretation on 07/31/2019 at 10:11 pm to provider ERIC [facility] , who verbally acknowledged these results. Electronically Signed   By: Marlan Palau M.D.   On: 07/31/2019 22:12   MR BRAIN WO CONTRAST  Result Date: 08/01/2019 CLINICAL DATA:  Stroke.  Endovascular thrombectomy 07/31/2019 EXAM: MRI HEAD WITHOUT CONTRAST TECHNIQUE: Multiplanar, multiecho pulse sequences of the brain and surrounding structures were obtained without intravenous contrast. COMPARISON:  CT a head and neck 07/31/2019 FINDINGS: Brain: Acute hemorrhagic infarction in the right caudate and right putamen. Small amount of acute infarct in the anterior limb internal capsule on the right. Small acute infarcts in the right frontal white matter and right occipital lobe. 2 cm acute infarct in the right medial temporal lobe. Ventricle size normal. No midline shift. Mild mass-effect on the right lateral ventricle due to acute infarcts in the right basal ganglia. Mild chronic ischemic change in the white  matter and pons. Chronic  hemorrhage in the left frontal lobe. Vascular: Normal arterial flow voids.  Normal flow void right MCA. Skull and upper cervical spine: No focal skeletal lesion. Sinuses/Orbits: Mild mucosal edema paranasal sinuses. Bilateral cataract surgery Other: None IMPRESSION: Acute hemorrhagic infarction in the right putamen and right caudate. Acute infarct extends into the right temporal lobe. Small areas of acute infarct right frontal and right occipital lobe Mild chronic microvascular ischemic change in the white matter. Electronically Signed   By: Marlan Palau M.D.   On: 08/01/2019 14:28   CT Code Stroke Cerebral Perfusion with contrast  Result Date: 07/31/2019 CLINICAL DATA:  Stroke.  Left-sided weakness EXAM: CT ANGIOGRAPHY HEAD AND NECK CT PERFUSION BRAIN TECHNIQUE: Multidetector CT imaging of the head and neck was performed using the standard protocol during bolus administration of intravenous contrast. Multiplanar CT image reconstructions and MIPs were obtained to evaluate the vascular anatomy. Carotid stenosis measurements (when applicable) are obtained utilizing NASCET criteria, using the distal internal carotid diameter as the denominator. Multiphase CT imaging of the brain was performed following IV bolus contrast injection. Subsequent parametric perfusion maps were calculated using RAPID software. CONTRAST:  OMNIPAQUE IOHEXOL 350 MG/ML SOLN 100 mL Isovue 370 IV COMPARISON:  CT head 07/31/2019 FINDINGS: Aortic arch: Standard branching. Imaged portion shows no evidence of aneurysm or dissection. No significant stenosis of the major arch vessel origins. Atherosclerotic disease in the aortic arch. Atherosclerotic plaque proximal left subclavian artery with approximately 50% diameter stenosis proximal to the vertebral artery. Right carotid system: Atherosclerotic calcification at the right carotid bifurcation without significant stenosis. Left carotid system: Atherosclerotic  calcification left carotid bifurcation. Approximately 50% diameter stenosis proximal left internal carotid artery. Vertebral arteries: Right vertebral artery dominant. Both vertebral arteries patent to the basilar. Mild calcific stenosis at the origin of the vertebral bilaterally. Skeleton: Cervical spondylosis.  No acute skeletal abnormality. Other neck: 7 x 12 mm right thyroid nodule. No adenopathy in the neck. Upper chest: Lung apices clear bilaterally. Review of the MIP images confirms the above findings CTA HEAD FINDINGS Anterior circulation: Atherosclerotic calcification right cavernous carotid which is patent without significant stenosis. Thrombus in the terminal right internal carotid artery extending into the right M1 and proximal right A1 segments. There is flow distal to the right M1 occlusion with decreased enhancement of right MCA branches. Right anterior cerebral artery patent supplied from the left Left cavernous carotid without significant stenosis. Diffuse atherosclerotic calcification. Left anterior and middle cerebral arteries patent bilaterally without stenosis. Posterior circulation: Both vertebral arteries patent to the basilar. PICA patent bilaterally. Basilar widely patent. Superior cerebellar and posterior cerebral arteries patent bilaterally without significant stenosis or occlusion. Venous sinuses: Limited venous contrast due to our field of arterial phase scanning. Anatomic variants: None CT Brain Perfusion Findings: ASPECTS: 9 CBF (<30%) Volume: 8mL Perfusion (Tmax>6.0s) volume: Mismatch Volume: 95mL Infarction Location:Core infarct right basal ganglia. Penumbra in the right temporal and parietal lobe in the right MCA territory. IMPRESSION: 1. 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 There is collateral circulation with decreased perfusion in the right MCA territory. 2. CT perfusion demonstrates 8 mL of core infarct  in the right basal ganglia. 95 mL of penumbra in the right temporoparietal lobe. 3. 50% diameter stenosis proximal left subclavian artery. 4. Right carotid atherosclerotic disease without significant stenosis. 50% diameter stenosis proximal left internal carotid artery. 5. These results were called by telephone at the time of interpretation on 07/31/2019 at  10:11 pm to provider ERIC LINDZEN , who verbally acknowledged these results. Electronically Signed   By: Marlan Palauharles  Clark M.D.   On: 07/31/2019 22:12   ECHOCARDIOGRAM COMPLETE  Result Date: 08/01/2019   ECHOCARDIOGRAM REPORT   Patient Name:   Veronica Holland Date of Exam: 08/01/2019 Medical Rec #:  [id number]    Height:       64.0 in Accession #:    [id number]   Weight:       149.9 lb Date of Birth:  04/22/1925     BSA:          1.73 m Patient Age:    85 years     BP:           116/63 mmHg Patient Gender: F            HR:           65 bpm. Exam Location:  Inpatient Procedure: 2D Echo, Cardiac Doppler, Color Doppler and Intracardiac            Opacification Agent Indications:    Stroke 434.91  History:        Patient has no prior history of Echocardiogram examinations.                 Stroke; Risk Factors:Non-Smoker.  Sonographer:    Tonia GhentJulia Underwood RDCS Referring Phys: [id number] ERIC LINDZEN IMPRESSIONS  1. Left ventricular ejection fraction, by visual estimation, is 55 to 60%. The left ventricle has normal function. There is no left ventricular hypertrophy.  2. Definity contrast agent was given IV to delineate the left ventricular endocardial borders.  3. Elevated left atrial pressure.  4. Left ventricular diastolic parameters are consistent with Grade II diastolic dysfunction (pseudonormalization).  5. Global right ventricle has normal systolic function.The right ventricular size is normal.  6. Left atrial size was normal.  7. Right atrial size was normal.  8. Mild mitral annular calcification.  9. The mitral valve is normal in structure. Trivial mitral valve  regurgitation. No evidence of mitral stenosis. 10. The tricuspid valve is normal in structure. 11. The aortic valve is tricuspid. Aortic valve regurgitation is not visualized. Mild aortic valve sclerosis without stenosis. 12. The pulmonic valve was normal in structure. Pulmonic valve regurgitation is trivial. 13. The inferior vena cava is normal in size with greater than 50% respiratory variability, suggesting right atrial pressure of 3 mmHg. 14. Definity used; normal LV systolic function; grade 2 diastolic dysfunction. FINDINGS  Left Ventricle: Left ventricular ejection fraction, by visual estimation, is 55 to 60%. The left ventricle has normal function. Definity contrast agent was given IV to delineate the left ventricular endocardial borders. The left ventricle is not well visualized. There is no left ventricular hypertrophy. Left ventricular diastolic parameters are consistent with Grade II diastolic dysfunction (pseudonormalization). Elevated left atrial pressure. Right Ventricle: The right ventricular size is normal.Global RV systolic function is has normal systolic function. Left Atrium: Left atrial size was normal in size. Right Atrium: Right atrial size was normal in size Pericardium: There is no evidence of pericardial effusion. Mitral Valve: The mitral valve is normal in structure. Mild mitral annular calcification. Trivial mitral valve regurgitation. No evidence of mitral valve stenosis by observation. Tricuspid Valve: The tricuspid valve is normal in structure. Tricuspid valve regurgitation is trivial. Aortic Valve: The aortic valve is tricuspid. Aortic valve regurgitation is not visualized. Mild aortic valve sclerosis is present, with no evidence of aortic valve stenosis. Pulmonic Valve: The pulmonic valve was normal  in structure. Pulmonic valve regurgitation is trivial. Pulmonic regurgitation is trivial. Aorta: The aortic root is normal in size and structure. Venous: The inferior vena cava was not  well visualized. The inferior vena cava is normal in size with greater than 50% respiratory variability, suggesting right atrial pressure of 3 mmHg.  Additional Comments: Definity used; normal LV systolic function; grade 2 diastolic dysfunction.  LEFT VENTRICLE PLAX 2D LVIDd:         4.52 cm       Diastology LVIDs:         3.08 cm       LV e' lateral:   7.71 cm/s LV PW:         1.01 cm       LV E/e' lateral: 11.9 LV IVS:        1.00 cm       LV e' medial:    4.99 cm/s LVOT diam:     1.70 cm       LV E/e' medial:  18.4 LV SV:         56 ml LV SV Index:   31.76 LVOT Area:     2.27 cm  LV Volumes (MOD) LV area d, A2C:    28.90 cm LV area d, A4C:    29.00 cm LV area s, A2C:    16.60 cm LV area s, A4C:    17.90 cm LV major d, A2C:   7.84 cm LV major d, A4C:   7.49 cm LV major s, A2C:   6.31 cm LV major s, A4C:   6.49 cm LV vol d, MOD A2C: 88.8 ml LV vol d, MOD A4C: 93.2 ml LV vol s, MOD A2C: 38.0 ml LV vol s, MOD A4C: 41.7 ml LV SV MOD A2C:     50.8 ml LV SV MOD A4C:     93.2 ml LV SV MOD BP:      52.8 ml RIGHT VENTRICLE RV S prime:     9.20 cm/s TAPSE (M-mode): 1.8 cm LEFT ATRIUM           Index       RIGHT ATRIUM           Index LA diam:      3.50 cm 2.02 cm/m  RA Area:     13.20 cm LA Vol (A2C): 43.7 ml 25.25 ml/m RA Volume:   27.10 ml  15.66 ml/m LA Vol (A4C): 39.4 ml 22.76 ml/m  AORTIC VALVE LVOT Vmax:   75.70 cm/s LVOT Vmean:  53.200 cm/s LVOT VTI:    0.179 m  AORTA Ao Root diam: 2.80 cm MITRAL VALVE MV Area (PHT): 3.12 cm             SHUNTS MV PHT:        70.47 msec           Systemic VTI:  0.18 m MV Decel Time: 243 msec             Systemic Diam: 1.70 cm MV E velocity: 91.70 cm/s 103 cm/s MV A velocity: 46.70 cm/s 70.3 cm/s MV E/A ratio:  1.96       1.5  Olga Millers MD Electronically signed by Olga Millers MD Signature Date/Time: 08/01/2019/11:51:31 AM    Final    CT HEAD CODE STROKE WO CONTRAST  Result Date: 07/31/2019 CLINICAL DATA:  Code stroke.  Left-sided weakness. Slurred speech. EXAM:  CT HEAD WITHOUT CONTRAST TECHNIQUE: Contiguous axial images were obtained from  the base of the skull through the vertex without intravenous contrast. COMPARISON:  CT head 05/18/2027 FINDINGS: Brain: Mild atrophy. Patchy white matter hypodensity bilaterally is unchanged. Mild asymmetric hypodensity right lateral basal ganglia. Insular cortex normal. Negative for hemorrhage. Vascular: Hyperdense right MCA. Skull: Negative Sinuses/Orbits: Paranasal sinuses clear.  Bilateral cataract surgery Other: None ASPECTS ([place] Stroke Program Early CT Score) - Ganglionic level infarction (caudate, lentiform nuclei, internal capsule, insula, M1-M3 cortex): 6 - Supraganglionic infarction (M4-M6 cortex): 3 Total score (0-10 with 10 being normal): 9 IMPRESSION: 1. Hyperdense right MCA compatible with acute thrombus. Probable early infarct right lateral basal ganglia 2. ASPECTS is 9 3. These results were called by telephone at the time of interpretation on 07/31/2019 at 9:41 pm to provider Dr.Lindzen , who verbally acknowledged these results. Electronically Signed   By: Franchot Gallo M.D.   On: 07/31/2019 21:42   Transthoracic Echocardiogram  Normal ejection fraction 55 to 60%.  Left ventricular hypertrophy.  No cardiac source of embolism. PHYSICAL EXAM Blood pressure (!) 117/55, pulse 78, temperature 99.5 F (37.5 C), temperature source Axillary, resp. rate (!) 23, height 5\' 4"  (1.626 m), weight 68 kg, SpO2 96 %.  GEN: Frail elderly Caucasian lady no acute distress. Frail, elderly lady Head: normocephalic Resp: normal effort, no distress CV: no edema.  GI: soft, nontender  Neurologic Examination: Mental Status: Alert  oriented x2.Marland Kitchen  Speech with intermittent dysfluency as well as dysarthria. Mild comprehension deficit. Able to follow all simple motor commands.  Cranial Nerves: II:  Left visual field cut. PERRL.  III,IV, VI: No ptosis. Rightward gaze deviation. Does attempt to cross midline to left.  V,VII: Left  facial droop. Facial temp sensation intact but slower to respond on the left.  VIII: hearing intact to questions and commands.  IX,X: Pharyngeal dysarthria noted.  XI: Shoulder shrug decreased on the left XII: Leftward tongue deviation Motor: RUE 5/5 and RLE is gen weak with attempt to hold against gravity, 4-/5 LUE with increased tone, 2/-35 proximally and distally LLE unable to elevate antigravity, drops to bed after passive elevation without any effort against gravity. There is a flicker in toes on command Sensory: Decreased responses to temp and FT on the left. Positive for extinction on the left.  Deep Tendon Reflexes:  Not tested Right toe downgoing, left toe upgoing.  Cerebellar: No gross ataxia  Gait: Unable to assess  HOME MEDICATIONS:  Medications Prior to Admission  Medication Sig Dispense Refill  . aspirin EC 81 MG tablet Take 81 mg by mouth daily.    . calcium-vitamin D (OSCAL WITH D) 500-200 MG-UNIT tablet Take 1 tablet by mouth daily with breakfast.    . clopidogrel (PLAVIX) 75 MG tablet Take 75 mg by mouth daily.    . insulin aspart (NOVOLOG) 100 UNIT/ML injection Inject 16 Units into the skin 3 (three) times daily before meals.    . insulin detemir (LEVEMIR) 100 UNIT/ML injection Inject 25 Units into the skin daily.    Marland Kitchen losartan (COZAAR) 100 MG tablet Take 25 mg by mouth daily.        HOSPITAL MEDICATIONS:  .  stroke: mapping our early stages of recovery book   Does not apply Once  . amiodarone  200 mg Oral Daily  . atorvastatin  20 mg Oral q1800  . Chlorhexidine Gluconate Cloth  6 each Topical Daily  . insulin aspart  0-9 Units Subcutaneous Q4H  . latanoprost  1 drop Both Eyes QHS  . losartan  25 mg  Oral Daily    ALLERGIES Not on File  ASSESSMENT/PLAN [name] is a 85 y.o. female with history of previous stroke in Sep with full recovery and resuming all activities of daily life (mRS 0). Now presenting with acute onset of left hemiplegia, left  hemineglect, left facial droop, left hemianopsia, dysarthria and anosognosia, NIHSS 19. She was not able to get IV tPA d/t outside of time window, but did proceeded to [facility] for thrombectomy with TICI 3 revascularization.  Stroke: Cardieoemboic RMCA stroke d/t new dx of Afib, not on OAC yet  Resultant  Left hemiplegia, neglect, dysarthria  Code Stroke CT Head -    ASPECTS 9  CT head - hyperdense R MCA  MRI head- pending  MRA head - n/a  CTA H&N - acute occlusion of terminal RICA extending to R M1/A1. 50% prox L subclavian artery stenosis noted. bilat carotid athero dz w/o significant stenosis.   CT Perfusion  Carotid Doppler - n/a  2D Echo -normal ejection fraction.  No cardiac source of embolism.    Sars Corona Virus 2  neg  LDL - 50    Component Value Date/Time   LDLCALC 50 08/01/2019 0355    HgbA1c - 6.9  UDS neg  VTE prophylaxis - Scd's post thrombectomy Diet  Diet Order            DIET - DYS 1 Room service appropriate? Yes with Assist; Fluid consistency: Nectar Thick  Diet effective now              ASA + Plavix prior to admission, none started yet post IA thrombectomy, but anticipate will need OAC for Afib  Patient will need to be counseled to be compliant with her antithrombotic medications  Ongoing aggressive stroke risk factor management  Therapy recommendations:  pending  Disposition:  Pending  Hypertension  Home BP meds: Cozaar  Current BP meds: none   Stable . Permissive hypertension (OK if < 180/110) but gradually normalize in 5-7 days . Long-term BP goal normotensive  Hyperlipidemia  Home Lipid lowering medication: none  LDL 50, goal < 70  Current lipid lowering medication: not indicated, below goal and advanced age  Diabetes  Home diabetic meds: Levemir, Novolog  Current diabetic meds: SSI   HgbA1c 6.9, goal < 7.0 Recent Labs    08/02/19 0831 08/02/19 1154 08/02/19 1513  GLUCAP 121* 161* 214*    Other Stroke Risk  Factors  Advanced age  Hx stroke/TIA   Atrial fibrillation, new diagnosis as out pt with "Zio patch", no OAC started yet  Hospital day # 2  She presented with right M1 occlusion and was treated with IV TPA followed by successful mechanical thrombectomy.  Neurological exams suggest right gaze preference left hemineglect and dense left hemiplegia.  MRI scan shows hemorrhagic right basal ganglia and cortical infarct.  Start aspirin 81 mg tomorrow.  She will eventually need anticoagulation but will have to wait for at least 7 to 10 days hemorrhage resolves.  Maintain strict control of blood pressure with systolic goal below 160.  Mobilize out of bed.  Therapy consults.  Transfer to floor bed.  She have a supportive family and want to take care of the patient at home and hope she qualifies to go to inpatient rehab. This patient is critically ill and at significant risk of neurological worsening, death and care requires constant monitoring of vital signs, hemodynamics,respiratory and cardiac monitoring, extensive review of multiple databases, frequent neurological assessment, discussion with family, other  specialists and medical decision making of high complexity.I have made any additions or clarifications directly to the above note.This critical care time does not reflect procedure time, or teaching time or supervisory time of PA/NP/Med Resident etc but could involve care discussion time.  I spent 30 minutes of neurocritical care time  in the care of  this patient.     Delia Heady, MD Medical Director [facility] Pager: [phone] 08/02/2019 3:26 PM  To contact Stroke Continuity provider, please refer to [email or web address]. After hours, contact General Neurology

## 2019-08-02 NOTE — Progress Notes (Signed)
Patient transferred from our sister ICU to [place]. Patient alert but sleeping. Will continue monitoring

## 2019-08-02 NOTE — Progress Notes (Signed)
Physical Therapy Treatment Patient Details Name: Veronica Holland MRN: [id number] DOB: [birth year] Today's Date: 08/02/2019    History of Present Illness 85 yo female s/p IR revasularization of R MCA with CT (+) R MCA occlusio. Head CT 12/24-Evolving acute infarction of the right basal ganglia and adjacent white matter with hyperdensity reflecting contrast staining and/orhemorrhage. Additional evolving acute infarction of the anterior right temporal lobe. PMH :  stroke, DM    PT Comments    Patient progressing slowly towards PT goals. Noted to have some active movement in left hand/digits/wrist and left knee today. Pt extremely lethargic during session. Worked on sitting balance and finding midline but pt having difficulty finding vertical upright, noted to have heavy left lateral lean worsened when fatigued. Tolerated squat pivot transfer to chair with Max A of 2. Used wedge to reposition in chair for midline. HR 96-132 bpm during session. Continues to have left inattention. Will continue to follow.    Follow Up Recommendations  CIR;Supervision/Assistance - 24 hour     Equipment Recommendations  Other (comment)(defer)    Recommendations for Other Services       Precautions / Restrictions Precautions Precautions: Fall Precaution Comments: heavy left lean Restrictions Weight Bearing Restrictions: No    Mobility  Bed Mobility Overal bed mobility: Needs Assistance Bed Mobility: Supine to Sit     Supine to sit: Max assist;HOB elevated     General bed mobility comments: Cues to push through right heel to scoot bottom towards EOB, assist with LLE and with trunk. Heavy left lateral lean.  Transfers Overall transfer level: Needs assistance Equipment used: 2 person hand held assist Transfers: Sit to/from [employer]     Squat pivot transfers: Max assist;+2 physical assistance     General transfer comment: Squat pivot transfer towards left side due to heavy left  lateral lean. Needed wedge to reposition in chair to get to midline.  Ambulation/Gait             General Gait Details: Unable.   Stairs             Wheelchair Mobility    Modified Rankin (Stroke Patients Only) Modified Rankin (Stroke Patients Only) Pre-Morbid Rankin Score: No symptoms Modified Rankin: Moderately severe disability     Balance Overall balance assessment: Needs assistance Sitting-balance support: Feet supported;Single extremity supported;No upper extremity supported Sitting balance-Leahy Scale: Poor Sitting balance - Comments: Heavy left lateral lean, able to initiate right lean to try to get to midline but not able to sustain, needs max cues. With fatigue, balance worsened leaning towards left side. Difficulty finding midline. Postural control: Left lateral lean   Standing balance-Leahy Scale: Zero                              Cognition Arousal/Alertness: Lethargic Behavior During Therapy: WFL for tasks assessed/performed Overall Cognitive Status: No family/caregiver present to determine baseline cognitive functioning                                 General Comments: Able to follow commands with repetition and increased time, very sleepy today. A&Ox4. left inattention. Able to get gaze to midline, right gaze preference.      Exercises      General Comments General comments (skin integrity, edema, etc.): HR 96-132 bpm during session. Noted to have some movement in left hand/digits/wrist and left knee  extension.      Pertinent Vitals/Pain Pain Assessment: Faces Faces Pain Scale: No hurt    Home Living                      Prior Function            PT Goals (current goals can now be found in the care plan section) Progress towards PT goals: Progressing toward goals    Frequency    Min 4X/week      PT Plan Current plan remains appropriate    Co-evaluation              AM-PAC PT "6  Clicks" Mobility   Outcome Measure  Help needed turning from your back to your side while in a flat bed without using bedrails?: A Lot Help needed moving from lying on your back to sitting on the side of a flat bed without using bedrails?: Total Help needed moving to and from a bed to a chair (including a wheelchair)?: Total Help needed standing up from a chair using your arms (e.g., wheelchair or bedside chair)?: Total Help needed to walk in hospital room?: Total Help needed climbing 3-5 steps with a railing? : Total 6 Click Score: 7    End of Session Equipment Utilized During Treatment: Gait belt Activity Tolerance: Patient tolerated treatment well;Patient limited by lethargy Patient left: in chair;with call bell/phone within reach;with chair alarm set Nurse Communication: Mobility status PT Visit Diagnosis: Hemiplegia and hemiparesis;Other symptoms and signs involving the nervous system (R29.898);Other abnormalities of gait and mobility (R26.89) Hemiplegia - Right/Left: Left Hemiplegia - dominant/non-dominant: Non-dominant Hemiplegia - caused by: Cerebral infarction     Time: 5053-9767 PT Time Calculation (min) (ACUTE ONLY): 23 min  Charges:  $Therapeutic Activity: 8-22 mins $Neuromuscular Re-education: 8-22 mins                     Marisa Severin, PT, DPT Acute Rehabilitation Services Pager [phone] Office [place] 08/02/2019, 12:45 PM

## 2019-08-03 ENCOUNTER — Inpatient Hospital Stay (HOSPITAL_COMMUNITY): Payer: Medicare Other

## 2019-08-03 LAB — GLUCOSE, CAPILLARY
Glucose-Capillary: 113 mg/dL — ABNORMAL HIGH (ref 70–99)
Glucose-Capillary: 100 mg/dL — ABNORMAL HIGH (ref 70–99)
Glucose-Capillary: 197 mg/dL — ABNORMAL HIGH (ref 70–99)
Glucose-Capillary: 160 mg/dL — ABNORMAL HIGH (ref 70–99)
Glucose-Capillary: 260 mg/dL — ABNORMAL HIGH (ref 70–99)
Glucose-Capillary: 144 mg/dL — ABNORMAL HIGH (ref 70–99)

## 2019-08-03 MED ORDER — ENOXAPARIN SODIUM 40 MG/0.4ML ~~LOC~~ SOLN
40.0000 mg | SUBCUTANEOUS | Status: DC
  Administered 2019-08-03 – 2019-08-09 (×7): 40 mg via SUBCUTANEOUS
  Filled 2019-08-03 (×7): qty 0.4

## 2019-08-03 NOTE — Progress Notes (Signed)
STROKE TEAM PROGRESS NOTE     INTERVAL HISTORY Patient is doing well.  She is sitting up in bed feeding herself.  She still has left hemiparesis but appears to be improving  OBJECTIVE Vitals:   08/02/19 2311 08/03/19 0000 08/03/19 0328 08/03/19 0735  BP: (!) 109/53 (!) 138/56 (!) 125/56 136/61  Pulse: 86 89 85 81  Resp: (!) 27 (!) 25 (!) 26 20  Temp: 98.3 F (36.8 C)  98.1 F (36.7 C) 99.1 F (37.3 C)  TempSrc: Oral  Oral Axillary  SpO2: 95% 94% 95% 96%  Weight:      Height:        CBC:  Recent Labs  Lab 07/31/19 2133 08/01/19 0222  WBC 8.5 9.7  NEUTROABS 7.8* 8.4*  HGB 12.6  13.3 11.4*  HCT 39.3  39.0 34.5*  MCV 94.2 92.2  PLT 229 536    Basic Metabolic Panel:  Recent Labs  Lab 07/31/19 2133 08/01/19 0222  NA 134*  137 135  K 4.4  4.4 3.9  CL 103  104 102  CO2 21* 22  GLUCOSE 292*  282* 287*  BUN 24*  23 17  CREATININE 1.03*  0.90 1.01*  CALCIUM 8.9 8.4*    Lipid Panel:     Component Value Date/Time   CHOL 99 08/01/2019 0355   TRIG 34 08/01/2019 0355   HDL 42 08/01/2019 0355   CHOLHDL 2.4 08/01/2019 0355   VLDL 7 08/01/2019 0355   LDLCALC 50 08/01/2019 0355   HgbA1c:  Lab Results  Component Value Date   HGBA1C 6.9 (H) 08/01/2019   Urine Drug Screen:     Component Value Date/Time   LABOPIA NONE DETECTED 07/31/2019 2227   COCAINSCRNUR NONE DETECTED 07/31/2019 2227   LABBENZ NONE DETECTED 07/31/2019 2227   AMPHETMU NONE DETECTED 07/31/2019 2227   THCU NONE DETECTED 07/31/2019 2227   LABBARB NONE DETECTED 07/31/2019 2227    Alcohol Level     Component Value Date/Time   ETH <10 08/01/2019 0537    IMAGING CT HEAD WO CONTRAST  Result Date: 08/02/2019 CLINICAL DATA:  Right MCA occlusion post tPA and thrombectomy EXAM: CT HEAD WITHOUT CONTRAST TECHNIQUE: Contiguous axial images were obtained from the base of the skull through the vertex without intravenous contrast. COMPARISON:  07/31/2019 FINDINGS: Brain: There is hyperdensity with  surrounding hypoattenuation of the right lentiform and caudate nuclei and adjacent white matter compatible with evolving acute infarction with superimposed contrast staining or reperfusion hemorrhage. There is partial effacement of the right lateral ventricle with trace leftward midline shift. There is no evidence of trapping. Additional hypoattenuation is present in the anterior right temporal lobe reflecting additional evolving infarction. Patchy hypoattenuation in the supratentorial white matter likely reflects superimposed chronic microvascular ischemic changes. Vascular: Resolution of right MCA hyperdensity. Skull: Calvarium is unremarkable. Sinuses/Orbits: No acute finding. Other: None. IMPRESSION: Evolving acute infarction of the right basal ganglia and adjacent white matter with hyperdensity reflecting contrast staining and/or hemorrhage. Additional evolving acute infarction of the anterior right temporal lobe. Mild mass effect is present. Electronically Signed   By: Macy Mis M.D.   On: 08/02/2019 09:46   MR BRAIN WO CONTRAST  Result Date: 08/01/2019 CLINICAL DATA:  Stroke.  Endovascular thrombectomy 07/31/2019 EXAM: MRI HEAD WITHOUT CONTRAST TECHNIQUE: Multiplanar, multiecho pulse sequences of the brain and surrounding structures were obtained without intravenous contrast. COMPARISON:  CT a head and neck 07/31/2019 FINDINGS: Brain: Acute hemorrhagic infarction in the right caudate and right putamen. Small amount  of acute infarct in the anterior limb internal capsule on the right. Small acute infarcts in the right frontal white matter and right occipital lobe. 2 cm acute infarct in the right medial temporal lobe. Ventricle size normal. No midline shift. Mild mass-effect on the right lateral ventricle due to acute infarcts in the right basal ganglia. Mild chronic ischemic change in the white matter and pons. Chronic hemorrhage in the left frontal lobe. Vascular: Normal arterial flow voids.  Normal  flow void right MCA. Skull and upper cervical spine: No focal skeletal lesion. Sinuses/Orbits: Mild mucosal edema paranasal sinuses. Bilateral cataract surgery Other: None IMPRESSION: Acute hemorrhagic infarction in the right putamen and right caudate. Acute infarct extends into the right temporal lobe. Small areas of acute infarct right frontal and right occipital lobe Mild chronic microvascular ischemic change in the white matter. Electronically Signed   By: Marlan Palauharles  Clark M.D.   On: 08/01/2019 14:28   ECHOCARDIOGRAM COMPLETE  Result Date: 08/01/2019   ECHOCARDIOGRAM REPORT   Patient Name:   Valma Mackowiak Date of Exam: 08/01/2019 Medical Rec #:  [id number]    Height:       64.0 in Accession #:    [id number]   Weight:       149.9 lb Date of Birth:  11/01/1924     BSA:          1.73 m Patient Age:    85 years     BP:           116/63 mmHg Patient Gender: F            HR:           65 bpm. Exam Location:  Inpatient Procedure: 2D Echo, Cardiac Doppler, Color Doppler and Intracardiac            Opacification Agent Indications:    Stroke 434.91  History:        Patient has no prior history of Echocardiogram examinations.                 Stroke; Risk Factors:Non-Smoker.  Sonographer:    Tonia GhentJulia Underwood RDCS Referring Phys: [id number] ERIC LINDZEN IMPRESSIONS  1. Left ventricular ejection fraction, by visual estimation, is 55 to 60%. The left ventricle has normal function. There is no left ventricular hypertrophy.  2. Definity contrast agent was given IV to delineate the left ventricular endocardial borders.  3. Elevated left atrial pressure.  4. Left ventricular diastolic parameters are consistent with Grade II diastolic dysfunction (pseudonormalization).  5. Global right ventricle has normal systolic function.The right ventricular size is normal.  6. Left atrial size was normal.  7. Right atrial size was normal.  8. Mild mitral annular calcification.  9. The mitral valve is normal in structure. Trivial mitral valve  regurgitation. No evidence of mitral stenosis. 10. The tricuspid valve is normal in structure. 11. The aortic valve is tricuspid. Aortic valve regurgitation is not visualized. Mild aortic valve sclerosis without stenosis. 12. The pulmonic valve was normal in structure. Pulmonic valve regurgitation is trivial. 13. The inferior vena cava is normal in size with greater than 50% respiratory variability, suggesting right atrial pressure of 3 mmHg. 14. Definity used; normal LV systolic function; grade 2 diastolic dysfunction. FINDINGS  Left Ventricle: Left ventricular ejection fraction, by visual estimation, is 55 to 60%. The left ventricle has normal function. Definity contrast agent was given IV to delineate the left ventricular endocardial borders. The left ventricle is not well visualized. There is no  left ventricular hypertrophy. Left ventricular diastolic parameters are consistent with Grade II diastolic dysfunction (pseudonormalization). Elevated left atrial pressure. Right Ventricle: The right ventricular size is normal.Global RV systolic function is has normal systolic function. Left Atrium: Left atrial size was normal in size. Right Atrium: Right atrial size was normal in size Pericardium: There is no evidence of pericardial effusion. Mitral Valve: The mitral valve is normal in structure. Mild mitral annular calcification. Trivial mitral valve regurgitation. No evidence of mitral valve stenosis by observation. Tricuspid Valve: The tricuspid valve is normal in structure. Tricuspid valve regurgitation is trivial. Aortic Valve: The aortic valve is tricuspid. Aortic valve regurgitation is not visualized. Mild aortic valve sclerosis is present, with no evidence of aortic valve stenosis. Pulmonic Valve: The pulmonic valve was normal in structure. Pulmonic valve regurgitation is trivial. Pulmonic regurgitation is trivial. Aorta: The aortic root is normal in size and structure. Venous: The inferior vena cava was not  well visualized. The inferior vena cava is normal in size with greater than 50% respiratory variability, suggesting right atrial pressure of 3 mmHg.  Additional Comments: Definity used; normal LV systolic function; grade 2 diastolic dysfunction.  LEFT VENTRICLE PLAX 2D LVIDd:         4.52 cm       Diastology LVIDs:         3.08 cm       LV e' lateral:   7.71 cm/s LV PW:         1.01 cm       LV E/e' lateral: 11.9 LV IVS:        1.00 cm       LV e' medial:    4.99 cm/s LVOT diam:     1.70 cm       LV E/e' medial:  18.4 LV SV:         56 ml LV SV Index:   31.76 LVOT Area:     2.27 cm  LV Volumes (MOD) LV area d, A2C:    28.90 cm LV area d, A4C:    29.00 cm LV area s, A2C:    16.60 cm LV area s, A4C:    17.90 cm LV major d, A2C:   7.84 cm LV major d, A4C:   7.49 cm LV major s, A2C:   6.31 cm LV major s, A4C:   6.49 cm LV vol d, MOD A2C: 88.8 ml LV vol d, MOD A4C: 93.2 ml LV vol s, MOD A2C: 38.0 ml LV vol s, MOD A4C: 41.7 ml LV SV MOD A2C:     50.8 ml LV SV MOD A4C:     93.2 ml LV SV MOD BP:      52.8 ml RIGHT VENTRICLE RV S prime:     9.20 cm/s TAPSE (M-mode): 1.8 cm LEFT ATRIUM           Index       RIGHT ATRIUM           Index LA diam:      3.50 cm 2.02 cm/m  RA Area:     13.20 cm LA Vol (A2C): 43.7 ml 25.25 ml/m RA Volume:   27.10 ml  15.66 ml/m LA Vol (A4C): 39.4 ml 22.76 ml/m  AORTIC VALVE LVOT Vmax:   75.70 cm/s LVOT Vmean:  53.200 cm/s LVOT VTI:    0.179 m  AORTA Ao Root diam: 2.80 cm MITRAL VALVE MV Area (PHT): 3.12 cm  SHUNTS MV PHT:        70.47 msec           Systemic VTI:  0.18 m MV Decel Time: 243 msec             Systemic Diam: 1.70 cm MV E velocity: 91.70 cm/s 103 cm/s MV A velocity: 46.70 cm/s 70.3 cm/s MV E/A ratio:  1.96       1.5  Olga Millers MD Electronically signed by Olga Millers MD Signature Date/Time: 08/01/2019/11:51:31 AM    Final    Cerebral angio 08/01/2019 S/P bilateral common carotid arteriograms followed by complete revascularization of RT ICA terminus,RT  MCA prox and RT ACA prox with x 1 pass with 4mm x 40 mm X soliaire retriever device and penumbra aspiration  Achieving a TICI 3 revascularization.  PHYSICAL EXAM    Blood pressure 136/61, pulse 81, temperature 99.1 F (37.3 C), temperature source Axillary, resp. rate 20, height  (1.626 m), weight 68 kg, SpO2 96 %.  GEN: Frail elderly Caucasian lady no acute distress. Frail, elderly lady Head: normocephalic Resp: normal effort, no distress CV: no edema.  GI: soft, nontender  Neurologic Examination: Mental Status: Alert  oriented x2.Marland Kitchen  Speech with intermittent dysfluency as well as dysarthria. Mild comprehension deficit. Able to follow all simple motor commands.  Cranial Nerves: II:  Left visual field cut. PERRL.  III,IV, VI: No ptosis. Rightward gaze deviation. Does  cross midline to left.  V,VII: Left facial droop. Facial temp sensation intact but slower to respond on the left.  VIII: hearing intact to questions and commands.  IX,X: Pharyngeal dysarthria noted.  XI: Shoulder shrug decreased on the left XII: Leftward tongue deviation Motor: RUE 5/5 and RLE is gen weak with attempt to hold against gravity, 4-/5 LUE with increased tone, 3/5 proximally and distally LLE unable to elevate antigravity, drops to bed after passive elevation without any effort against gravity. There is a flicker in toes on command Sensory: Decreased responses to temp and FT on the left. Positive for extinction on the left.  Deep Tendon Reflexes:  Not tested Right toe downgoing, left toe upgoing.  Cerebellar: No gross ataxia  Gait: Unable to assess   ASSESSMENT/PLAN [name] is a 85 y.o. female with history of previous stroke in Sep with full recovery and resuming all activities of daily life (mRS 0). Now presenting with acute onset of left hemiplegia, left hemineglect, left facial droop, left hemianopsia, dysarthria and anosognosia, NIHSS 19. She was not able to get IV tPA d/t outside of time  window, but did proceeded to [facility] for thrombectomy with TICI 3 revascularization.  Stroke: Cardieoemboic RMCA stroke d/t recent dx of Afib s/p IR w/ hemorrhagic transformation vs contrast staining  Code Stroke CT Head -    ASPECTS 9  CT head - hyperdense R MCA  CTA H&N - acute occlusion of terminal RICA extending to R M1/A1. 50% prox L subclavian artery stenosis noted. bilat carotid athero dz w/o significant stenosis.   Cerebral angio R ICA, R MCA, R ACA occlusions with TICI 3 revascularization using solitaire and penumbra aspiration   Post IR CT  No ICH or mass effect.contrast stain in the RT  Putamen region.   MRI head hemorrhagic infarct R putamen and R caudate into R temporal lobe. Acute infarcts R frontal lobe and R occipital lobes. Small vessel disease.    Repeat CT head 12/24 evolving infarct R basal ganglia and adjacent white matter w/ contrast staining  vs hemorrhage. Additional R temporal lobe infarcts. Mild mass effect.   2D Echo -normal ejection fraction.  No cardiac source of embolism.    Sars Corona Virus 2  neg  LDL - 50  HgbA1c - 6.9  UDS neg  VTE prophylaxis - Scd's changed to Lovenox 40 mg sq daily    ASA + Plavix prior to admission, on low dose aspirin. Plan AC at 5 days if remains stable   Therapy recommendations:  CIR, pt may want to go to [facility] in [facility] - rehab admissions coordinator following   Disposition:  Pending  (note pt is on 4NP and in a medical tele bed)  Atrial Fibrillation, recent onset  Dx confirmed PTA w/ patch  On pacerone 200 daily  CHA2DS2-VASc Score = at least 7, ?2 oral anticoagulation recommended  Age in Years:  ?33   +2    Sex:  Female   Female   +1    Hypertension History:  yes   +1     Diabetes Mellitus:  yes   +1  Congestive Heart Failure History:  0  Vascular Disease History:  0     Stroke/TIA/Thromboembolism History:  yes   +2 . Plan AC at 5 days if remains stable    Hypertension  Home BP meds: Cozaar  Current BP  meds: Cozaar  BP Stable . Permissive hypertension (OK if < 180/110) but gradually normalize in 5-7 days . Long-term BP goal normotensive  Hyperlipidemia  Home Lipid lowering medication: lipitor 20  LDL 50, goal < 70  Current lipid lowering medication: lipitor 20  High intensity statin not indicated as below goal and advanced age  Diabetes  Home diabetic meds: Levemir, Novolog  Current diabetic meds: Levemir 25u, SSI   HgbA1c 6.9, goal < 7.0 Recent Labs    08/02/19 2314 08/03/19 0331 08/03/19 0736  GLUCAP 156* 113* 100*    Dysphagia . Secondary to stroke    Diet   DIET - DYS 1 Room service appropriate? Yes with Assist; Fluid consistency: Nectar Thick  .   Marland Kitchen Speech on board   Other Stroke Risk Factors  Advanced age  Hx stroke/TIA  Acute blood loss anemia post IR 13.3->11.4  Hospital day # 3 Patient continues to make gradual recovery.  Continue ongoing therapies.  Mobilize out of bed.  Hopefully transfer to inpatient rehab in the next few days when bed available.  Greater than 50% time during this 25-minute visit was spent on counseling and coordination of care and discussion with care team.  Delia Heady, MD Medical Director [facility] Pager: [phone] 08/03/2019 8:52 AM  To contact Stroke Continuity provider, please refer to [email or web address]. After hours, contact General Neurology

## 2019-08-04 ENCOUNTER — Inpatient Hospital Stay (HOSPITAL_COMMUNITY): Payer: Medicare Other

## 2019-08-04 DIAGNOSIS — M25551 Pain in right hip: Secondary | ICD-10-CM

## 2019-08-04 DIAGNOSIS — D72829 Elevated white blood cell count, unspecified: Secondary | ICD-10-CM

## 2019-08-04 DIAGNOSIS — R509 Fever, unspecified: Secondary | ICD-10-CM

## 2019-08-04 DIAGNOSIS — I480 Paroxysmal atrial fibrillation: Secondary | ICD-10-CM

## 2019-08-04 DIAGNOSIS — I63411 Cerebral infarction due to embolism of right middle cerebral artery: Principal | ICD-10-CM

## 2019-08-04 LAB — GLUCOSE, CAPILLARY
Glucose-Capillary: 88 mg/dL (ref 70–99)
Glucose-Capillary: 108 mg/dL — ABNORMAL HIGH (ref 70–99)
Glucose-Capillary: 105 mg/dL — ABNORMAL HIGH (ref 70–99)
Glucose-Capillary: 202 mg/dL — ABNORMAL HIGH (ref 70–99)
Glucose-Capillary: 241 mg/dL — ABNORMAL HIGH (ref 70–99)

## 2019-08-04 LAB — URINALYSIS, COMPLETE (UACMP) WITH MICROSCOPIC
Bacteria, UA: NONE SEEN
Bilirubin Urine: NEGATIVE
Glucose, UA: 150 mg/dL — AB
Hgb urine dipstick: NEGATIVE
Ketones, ur: NEGATIVE mg/dL
Leukocytes,Ua: NEGATIVE
Nitrite: NEGATIVE
Protein, ur: 30 mg/dL — AB
Specific Gravity, Urine: 1.014 (ref 1.005–1.030)
pH: 7 (ref 5.0–8.0)

## 2019-08-04 LAB — BASIC METABOLIC PANEL
Anion gap: 9 (ref 5–15)
BUN: 13 mg/dL (ref 8–23)
CO2: 21 mmol/L — ABNORMAL LOW (ref 22–32)
Calcium: 8.3 mg/dL — ABNORMAL LOW (ref 8.9–10.3)
Chloride: 108 mmol/L (ref 98–111)
Creatinine, Ser: 0.74 mg/dL (ref 0.44–1.00)
GFR calc Af Amer: >60 mL/min (ref >60)
GFR calc non Af Amer: >60 mL/min (ref >60)
Glucose, Bld: 90 mg/dL (ref 70–99)
Potassium: 3.7 mmol/L (ref 3.5–5.1)
Sodium: 138 mmol/L (ref 135–145)

## 2019-08-04 LAB — CBC
HCT: 33.8 % — ABNORMAL LOW (ref 36.0–46.0)
Hemoglobin: 11.5 g/dL — ABNORMAL LOW (ref 12.0–15.0)
MCH: 30.4 pg (ref 26.0–34.0)
MCHC: 34 g/dL (ref 30.0–36.0)
MCV: 89.4 fL (ref 80.0–100.0)
Platelets: 183 10*3/uL (ref 150–400)
RBC: 3.78 MIL/uL — ABNORMAL LOW (ref 3.87–5.11)
RDW: 12.5 % (ref 11.5–15.5)
WBC: 11.4 10*3/uL — ABNORMAL HIGH (ref 4.0–10.5)
nRBC: 0 % (ref 0.0–0.2)

## 2019-08-04 MED ORDER — RESOURCE THICKENUP CLEAR PO POWD
ORAL | Status: DC | PRN
  Filled 2019-08-04: qty 125

## 2019-08-04 MED ORDER — ASPIRIN EC 325 MG PO TBEC
325.0000 mg | DELAYED_RELEASE_TABLET | Freq: Every day | ORAL | Status: DC
  Administered 2019-08-05 – 2019-08-09 (×5): 325 mg via ORAL
  Filled 2019-08-04 (×5): qty 1

## 2019-08-04 MED ORDER — INSULIN ASPART 100 UNIT/ML ~~LOC~~ SOLN
0.0000 [IU] | Freq: Three times a day (TID) | SUBCUTANEOUS | Status: DC
  Administered 2019-08-04: 3 [IU] via SUBCUTANEOUS
  Administered 2019-08-05 (×2): 1 [IU] via SUBCUTANEOUS
  Administered 2019-08-06 – 2019-08-07 (×2): 3 [IU] via SUBCUTANEOUS
  Administered 2019-08-08 – 2019-08-09 (×2): 1 [IU] via SUBCUTANEOUS
  Administered 2019-08-09: 3 [IU] via SUBCUTANEOUS

## 2019-08-04 NOTE — Progress Notes (Signed)
Pt. Complained this am of 5/10 pain in groin area. Pt. Pointed to site of groin puncture to where the pain was and said it was radiating down her thigh. Pt. Given tylenol. Later in the day, pt. Complained of 10/10 pain in the same spot. Dr. Arville Go aware of new onset severe pain. Dr. Durward Fortes to inspect area for hematoma. Thigh was visualized and bandage pulled back to visualize and palpate groin puncture. No abnormalities visualized or felt. Dr. Vonzella Nipple pelvic x-ray and recommended PT to follow.

## 2019-08-04 NOTE — Progress Notes (Signed)
STROKE TEAM PROGRESS NOTE   INTERVAL HISTORY Patient is lying in bed. Awake alert and orientated. Still has left hemiparesis, but no neglect. Complains of RLE pain from hip down, however, not at knee, calf or ankle or foot. On dys 1 diet and nectar thick liquid, complains of mouth dry.   OBJECTIVE Vitals:   08/03/19 2323 08/04/19 0337 08/04/19 0634 08/04/19 0735  BP: 125/67 129/60  120/63  Pulse: 90 94  86  Resp: (!) 29 (!) 24  (!) 25  Temp: 100.1 F (37.8 C) 98.7 F (37.1 C) (!) 101.1 F (38.4 C) 100.2 F (37.9 C)  TempSrc: Oral Oral Rectal Axillary  SpO2: 97% 95%  94%  Weight:      Height:        CBC:  Recent Labs  Lab 07/31/19 2133 08/01/19 0222 08/04/19 0630  WBC 8.5 9.7 11.4*  NEUTROABS 7.8* 8.4*  --   HGB 12.6  13.3 11.4* 11.5*  HCT 39.3  39.0 34.5* 33.8*  MCV 94.2 92.2 89.4  PLT 229 223 183    Basic Metabolic Panel:  Recent Labs  Lab 08/01/19 0222 08/04/19 0630  NA 135 138  K 3.9 3.7  CL 102 108  CO2 22 21*  GLUCOSE 287* 90  BUN 17 13  CREATININE 1.01* 0.74  CALCIUM 8.4* 8.3*    Lipid Panel:     Component Value Date/Time   CHOL 99 08/01/2019 0355   TRIG 34 08/01/2019 0355   HDL 42 08/01/2019 0355   CHOLHDL 2.4 08/01/2019 0355   VLDL 7 08/01/2019 0355   LDLCALC 50 08/01/2019 0355   HgbA1c:  Lab Results  Component Value Date   HGBA1C 6.9 (H) 08/01/2019   Urine Drug Screen:     Component Value Date/Time   LABOPIA NONE DETECTED 07/31/2019 2227   COCAINSCRNUR NONE DETECTED 07/31/2019 2227   LABBENZ NONE DETECTED 07/31/2019 2227   AMPHETMU NONE DETECTED 07/31/2019 2227   THCU NONE DETECTED 07/31/2019 2227   LABBARB NONE DETECTED 07/31/2019 2227    Alcohol Level     Component Value Date/Time   ETH <10 08/01/2019 0537    IMAGING DG Knee 1-2 Views Right  Result Date: 08/03/2019 CLINICAL DATA:  Increasing knee pain aggravated by physical activity today. No acute injury. EXAM: RIGHT KNEE - 1-2 VIEW COMPARISON:  Radiographs  05/11/2013. FINDINGS: The mineralization and alignment are normal. There is no evidence of acute fracture or dislocation. 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 No significant joint effusion. Mild femoral atherosclerosis. IMPRESSION: No acute osseous findings. 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 Electronically Signed   By: Veronica Holland M.D.   On: 08/03/2019 16:46   DG CHEST PORT 1 VIEW  Result Date: 08/04/2019 CLINICAL DATA:  Fever and leukocytosis. EXAM: PORTABLE CHEST 1 VIEW COMPARISON:  06/13/2017 FINDINGS: Lungs are hypoinflated with subtle hazy interstitial prominence bilaterally. There is opacification over the left base with obscuration of the hemidiaphragm likely left effusion with associated basilar atelectasis. Cardiomediastinal silhouette and remainder the exam is unchanged. IMPRESSION: Left base opacification likely effusion with atelectasis. Infection in the left base is possible. Subtle hazy interstitial prominence which may be due to edema or infection. Electronically Signed   By: Veronica Holland M.D.   On: 08/04/2019 10:13   DG Shoulder Left  Result Date: 08/03/2019 CLINICAL DATA:  Left shoulder pain.  Acute hemorrhagic infarct. EXAM: LEFT SHOULDER - 2+ VIEW COMPARISON:  None. FINDINGS: There is no evidence of fracture or dislocation. There is no evidence of arthropathy or other focal bone abnormality. Soft tissues are unremarkable. IMPRESSION: Negative left shoulder radiographs. Electronically Signed   By: Veronica Robertshristopher  Mattern M.D.   On: 08/03/2019 16:45   Cerebral angio 08/01/2019 S/P bilateral common carotid arteriograms followed by complete revascularization of RT ICA terminus,RT MCA prox and RT ACA prox with x 1 pass with 4mm x 40 mm X soliaire retriever device and penumbra aspiration  Achieving a TICI 3 revascularization.  PHYSICAL  EXAM     Temp:  [98.2 F (36.8 C)-101.1 F (38.4 C)] 100.2 F (37.9 C) (12/26 0735) Pulse Rate:  [86-94] 86 (12/26 0735) Resp:  [24-29] 25 (12/26 0735) BP: (120-132)/(51-84) 120/63 (12/26 0735) SpO2:  [94 %-100 %] 94 % (12/26 0735)  General - Well nourished, well developed, in no apparent distress.  Ophthalmologic - fundi not visualized due to noncooperation.  Cardiovascular - irregularly irregular heart rate and rhythm.  Neuro - awake alert, orientated to self, age, place, but not to time. Eyes open, no aphasia, mild dysarthria, following all simple commands. Able to name and repeat. Right gaze preference but able to have complete left gaze, visual field full, no hemianopia. Tracking bilaterally. PERRL. Left facial droop. Tongue midline. RUE and RLE 5/5. LUE 3/5 proximal and distal. LLE 3-/5 proximal and 3/5 distal. Left babinski. DTR 1+. Sensation symmetrical. Right FTN intact. Gait not tested.    ASSESSMENT/PLAN [name] is a 85 y.o. female with history of previous stroke in Sep with full recovery and resuming all activities of daily life (mRS 0). Now presenting with acute onset of left hemiplegia, left hemineglect, left facial droop, left hemianopsia, dysarthria and anosognosia, NIHSS 19. She was not able to get IV tPA d/t outside of time window, but [facility]I 3 revascularization.  Stroke: R MCA stroke with right tICA, MCA, ACA occlusion s/p IR w/ hemorrhagic transformation, likely due to new diagnosis of Afib  CT Head -  Non acute.  ASPECTS 9. hyperdense R MCA  CTA H&N - acute occlusion of terminal RICA extending to R M1/A1. Bilat carotid athero dz w/o significant stenosis.   Cerebral angio R ICA, R MCA, R ACA occlusions with TICI 3 revascularization   MRI head hemorrhagic infarct R putamen and R caudate into R temporal lobe. Acute infarcts R frontal lobe and R occipital lobes. Small vessel disease.    Repeat CT head 12/24 evolving  infarct R basal ganglia and adjacent white matter w/ contrast staining vs hemorrhage. Additional R temporal lobe infarcts. Mild mass effect.   2D Echo - normal ejection fraction.  No cardiac source of embolism.    LDL - 50  HgbA1c - 6.9  UDS neg  VTE prophylaxis - Lovenox 40 mg sq daily    ASA and Plavix prior to admission, now on ASA 325. Plan for DOAC in 10-14 days post stroke given hemorrhagic conversion seen on CT  Therapy recommendations:  CIR vs. SNF  Disposition:  Pending  Atrial Fibrillation, new diagnosis  Dx confirmed PTA w/ patch  On pacerone 200 daily  CHA2DS2-VASc Score = at least 7, ?2 oral anticoagulation recommended  Age in Years:  ?[age]   +2    Sex:  Female   Female   +1    Hypertension History:  yes   +1     Diabetes Mellitus:  yes   +1  Congestive Heart Failure History:  0  Vascular Disease History:  0     Stroke/TIA/Thromboembolism History:  yes   +2 . Plan AC in 10-14 days post stroke if remains stable given hemorrhagic conversion   Hypertension  Home BP meds: Cozaar  Current BP meds: Cozaar  BP Stable . Permissive hypertension (OK if < 180/110) but gradually normalize in 5-7 days . Long-term BP goal normotensive  Hyperlipidemia  Home Lipid lowering medication: lipitor 20  LDL 50, goal < 70  Current lipid lowering medication: lipitor 20  High intensity statin not indicated as below goal and advanced age  Diabetes  Home diabetic meds: Levemir, Novolog  HgbA1c 6.9, goal < 7.0  SSI   On levemir  CBG monitoring  Fever and leukocytosis  WBC 11.4   Tmax - 101.1  UA neg  Urine and blood culture pending  CXR Left base opacification likely effusion with atelectasis. Infection in the left base is possible.  Dysphagia . Secondary to stroke . Speech on board . On dys 1 diet with nectar thick . Pt intake not adequate . Continue NS @ 40   Other Stroke Risk Factors  Advanced age  Hx stroke/TIA - stroke 04/2019 without residue  as per chart - no details  Active Problems   Acute blood loss anemia IR IR 13.3->11.4->11.5  Right hip pain - severe right hip osteoarthritis on XR   Hospital day # 4  I spent  35 minutes in total face-to-face time with the patient, more than 50% of which was spent in counseling and coordination of care, reviewing test results, images and medication, and discussing the diagnosis of right MCA stroke, status post IR, new diagnosis A. fib, fever with leukocytosis and dysphagia, treatment plan and potential prognosis. This patient's care requiresreview of multiple databases, neurological assessment, discussion with family, other specialists and medical decision making of high complexity.  Rosalin Hawking, MD PhD Stroke Neurology 08/04/2019 7:01 PM   To contact Stroke Continuity provider, please refer to [email or web address] After hours, contact General Neurology

## 2019-08-05 LAB — URINE CULTURE

## 2019-08-05 LAB — BASIC METABOLIC PANEL
Anion gap: 9 (ref 5–15)
BUN: 14 mg/dL (ref 8–23)
CO2: 21 mmol/L — ABNORMAL LOW (ref 22–32)
Calcium: 8.3 mg/dL — ABNORMAL LOW (ref 8.9–10.3)
Chloride: 104 mmol/L (ref 98–111)
Creatinine, Ser: 0.85 mg/dL (ref 0.44–1.00)
GFR calc Af Amer: >60 mL/min (ref >60)
GFR calc non Af Amer: 59 mL/min — ABNORMAL LOW (ref >60)
Glucose, Bld: 217 mg/dL — ABNORMAL HIGH (ref 70–99)
Potassium: 3.6 mmol/L (ref 3.5–5.1)
Sodium: 134 mmol/L — ABNORMAL LOW (ref 135–145)

## 2019-08-05 LAB — CBC
HCT: 34.7 % — ABNORMAL LOW (ref 36.0–46.0)
Hemoglobin: 11.5 g/dL — ABNORMAL LOW (ref 12.0–15.0)
MCH: 30.2 pg (ref 26.0–34.0)
MCHC: 33.1 g/dL (ref 30.0–36.0)
MCV: 91.1 fL (ref 80.0–100.0)
Platelets: 196 10*3/uL (ref 150–400)
RBC: 3.81 MIL/uL — ABNORMAL LOW (ref 3.87–5.11)
RDW: 12.6 % (ref 11.5–15.5)
WBC: 10.2 10*3/uL (ref 4.0–10.5)
nRBC: 0 % (ref 0.0–0.2)

## 2019-08-05 LAB — GLUCOSE, CAPILLARY
Glucose-Capillary: 132 mg/dL — ABNORMAL HIGH (ref 70–99)
Glucose-Capillary: 136 mg/dL — ABNORMAL HIGH (ref 70–99)
Glucose-Capillary: 128 mg/dL — ABNORMAL HIGH (ref 70–99)
Glucose-Capillary: 180 mg/dL — ABNORMAL HIGH (ref 70–99)

## 2019-08-05 MED ORDER — BISACODYL 10 MG RE SUPP
10.0000 mg | Freq: Once | RECTAL | Status: AC
  Administered 2019-08-05: 10 mg via RECTAL
  Filled 2019-08-05: qty 1

## 2019-08-05 MED ORDER — FLEET ENEMA 7-19 GM/118ML RE ENEM
1.0000 | ENEMA | Freq: Once | RECTAL | Status: DC
  Filled 2019-08-05: qty 1

## 2019-08-05 NOTE — Progress Notes (Signed)
Pt reports she is not currently hungry and her lower ABD hurts. Bowel sounds are decreased in all 4 quadrants. She has not had a BM this admission. Will follow up with provider on call. Katherina Right RN

## 2019-08-05 NOTE — Progress Notes (Signed)
STROKE TEAM PROGRESS NOTE   INTERVAL HISTORY Patient is lying in bed. Awake alert and orientated. Right hip pain resolved and now more right knee pain. Right hip XR showed severe arthritis but no fracture. Right knee no swelling or redness.   OBJECTIVE Vitals:   08/04/19 1600 08/04/19 2012 08/04/19 2337 08/05/19 0420  BP: (!) 149/76 (!) 123/53 (!) 144/71 (!) 129/58  Pulse:  79 88 91  Resp:  (!) 22 (!) 24 (!) 25  Temp:  98.3 F (36.8 C) 98.3 F (36.8 C) (!) 97.4 F (36.3 C)  TempSrc:  Axillary Oral Oral  SpO2:  96% 97% 96%  Weight:      Height:        CBC:  Recent Labs  Lab 07/31/19 2133 08/01/19 0222 08/04/19 0630 08/05/19 0218  WBC 8.5 9.7 11.4* 10.2  NEUTROABS 7.8* 8.4*  --   --   HGB 12.6  13.3 11.4* 11.5* 11.5*  HCT 39.3  39.0 34.5* 33.8* 34.7*  MCV 94.2 92.2 89.4 91.1  PLT 229 223 183 196    Basic Metabolic Panel:  Recent Labs  Lab 08/04/19 0630 08/05/19 0218  NA 138 134*  K 3.7 3.6  CL 108 104  CO2 21* 21*  GLUCOSE 90 217*  BUN 13 14  CREATININE 0.74 0.85  CALCIUM 8.3* 8.3*    Lipid Panel:     Component Value Date/Time   CHOL 99 08/01/2019 0355   TRIG 34 08/01/2019 0355   HDL 42 08/01/2019 0355   CHOLHDL 2.4 08/01/2019 0355   VLDL 7 08/01/2019 0355   LDLCALC 50 08/01/2019 0355   HgbA1c:  Lab Results  Component Value Date   HGBA1C 6.9 (H) 08/01/2019   Urine Drug Screen:     Component Value Date/Time   LABOPIA NONE DETECTED 07/31/2019 2227   COCAINSCRNUR NONE DETECTED 07/31/2019 2227   LABBENZ NONE DETECTED 07/31/2019 2227   AMPHETMU NONE DETECTED 07/31/2019 2227   THCU NONE DETECTED 07/31/2019 2227   LABBARB NONE DETECTED 07/31/2019 2227    Alcohol Level     Component Value Date/Time   ETH <10 08/01/2019 0537    IMAGING DG Knee 1-2 Views Right  Result Date: 08/03/2019 CLINICAL DATA:  Increasing knee pain aggravated by physical activity today. No acute injury. EXAM: RIGHT KNEE - 1-2 VIEW COMPARISON:  Radiographs 05/11/2013.  FINDINGS: The mineralization and alignment are normal. There is no evidence of acute fracture or dislocation. 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 No significant joint effusion. Mild femoral atherosclerosis. IMPRESSION: No acute osseous findings. 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 Electronically Signed   By: Carey Bullocks M.D.   On: 08/03/2019 16:46   DG CHEST PORT 1 VIEW  Result Date: 08/04/2019 CLINICAL DATA:  Fever and leukocytosis. EXAM: PORTABLE CHEST 1 VIEW COMPARISON:  06/13/2017 FINDINGS: Lungs are hypoinflated with subtle hazy interstitial prominence bilaterally. There is opacification over the left base with obscuration of the hemidiaphragm likely left effusion with associated basilar atelectasis. Cardiomediastinal silhouette and remainder the exam is unchanged. IMPRESSION: Left base opacification likely effusion with atelectasis. Infection in the left base is possible. Subtle hazy interstitial prominence which may be due to edema or infection. Electronically Signed   By: Elberta Fortis M.D.   On: 08/04/2019 10:13   DG Shoulder Left  Result Date: 08/03/2019 CLINICAL DATA:  Left shoulder pain.  Acute hemorrhagic infarct. EXAM: LEFT SHOULDER - 2+ VIEW COMPARISON:  None. FINDINGS: There is no evidence of fracture or dislocation. There is no evidence of arthropathy or other focal bone abnormality. Soft tissues are unremarkable. IMPRESSION: Negative left shoulder radiographs. Electronically Signed   By: Marin Robertshristopher  Mattern M.D.   On: 08/03/2019 16:45   DG HIP PORT UNILAT WITH PELVIS 1V RIGHT  Result Date: 08/04/2019 CLINICAL DATA:  85 year old with RIGHT hip pain of unspecified chronicity. EXAM: DG HIP (WITH OR WITHOUT PELVIS) 1V PORT RIGHT COMPARISON:  None. FINDINGS: No evidence of acute or subacute fracture or dislocation. Severe axial joint  space narrowing with associated hypertrophic spurring involving the femoral head. Well-preserved bone mineral density for patient age. Included AP pelvis demonstrates moderate axial joint space narrowing involving the contralateral LEFT hip. Sacroiliac joints and symphysis pubis anatomically aligned without significant degenerative changes. Mild degenerative changes involving the visualized lower lumbar spine. IMPRESSION: 1. No acute or subacute osseous abnormality. 2. Severe osteoarthritis of the right hip. 3. Moderate osteoarthritis involving the contralateral LEFT hip. Electronically Signed   By: Hulan Saashomas  Lawrence M.D.   On: 08/04/2019 17:30   Cerebral angio 08/01/2019 S/P bilateral common carotid arteriograms followed by complete revascularization of RT ICA terminus,RT MCA prox and RT ACA prox with x 1 pass with 4mm x 40 mm X soliaire retriever device and penumbra aspiration  Achieving a TICI 3 revascularization.  PHYSICAL EXAM     Temp:  [97.4 F (36.3 C)-100.2 F (37.9 C)] 97.4 F (36.3 C) (12/27 0420) Pulse Rate:  [79-97] 91 (12/27 0420) Resp:  [19-25] 25 (12/27 0420) BP: (120-163)/(53-87) 129/58 (12/27 0420) SpO2:  [94 %-97 %] 96 % (12/27 0420)  General - Well nourished, well developed, in no apparent distress.  Ophthalmologic - fundi not visualized due to noncooperation.  Cardiovascular - irregularly irregular heart rate and rhythm.  Neuro - awake alert, orientated to self, age, place, but not to time. Eyes open, no aphasia, mild dysarthria, following all simple commands. Able to name and repeat. Bilateral full gaze, no eye deviation, visual field full, no hemianopia. Tracking bilaterally. PERRL. Left facial droop. Tongue midline. RUE 5/5 and RLE 3/5 proximal due to knee pain, 5/5 distally. LUE 3/5 proximal and distal. LLE 3/5 proximal and 4/5 distal. Left positive babinski. DTR 1+. Sensation symmetrical. Right FTN intact. Gait not tested.    ASSESSMENT/PLAN Veronica Holland is a  85 y.o. female with history of previous stroke in Sep with full recovery and resuming all activities of daily life (mRS 0). Now presenting with acute onset of left hemiplegia, left hemineglect, left facial droop, left hemianopsia, dysarthria and anosognosia, NIHSS 19. She was not able to get IV tPA d/t outside of time window, but [facility]I 3 revascularization.  Stroke: R MCA stroke with right tICA, MCA, ACA occlusion s/p IR w/ hemorrhagic transformation, likely due to new diagnosis of Afib  CT Head -  Non acute.  ASPECTS 9. hyperdense R MCA  CTA H&N - acute occlusion of terminal RICA extending to R M1/A1. Bilat carotid athero dz w/o significant stenosis.   Cerebral angio R ICA, R MCA, R ACA occlusions with TICI 3 revascularization   MRI head hemorrhagic infarct R putamen and R caudate into R temporal lobe. Acute infarcts R frontal lobe and R occipital lobes. Small vessel disease.    Repeat CT head 12/24 evolving infarct R basal ganglia and adjacent white matter w/ contrast staining vs hemorrhage. Additional R temporal lobe infarcts. Mild mass effect.   2D Echo -  normal ejection fraction.  No cardiac source of embolism.    LDL - 50  HgbA1c - 6.9  UDS neg  VTE prophylaxis - Lovenox 40 mg sq daily    ASA and Plavix prior to admission, now on ASA 325. Plan for DOAC in 10-14 days post stroke given hemorrhagic conversion seen on CT  Therapy recommendations:  CIR vs. SNF  Disposition:  Pending  Atrial Fibrillation, new diagnosis  Dx confirmed PTA w/ patch  On pacerone 200 daily  CHA2DS2-VASc Score = at least 7, ?2 oral anticoagulation recommended  Age in Years:  ?21   +2    Sex:  Female   Female   +1    Hypertension History:  yes   +1     Diabetes Mellitus:  yes   +1  Congestive Heart Failure History:  0  Vascular Disease History:  0     Stroke/TIA/Thromboembolism History:  yes   +2 . Plan AC in 10-14 days post stroke if remains stable given  hemorrhagic conversion   Hypertension  Home BP meds: Cozaar  Current BP meds: Cozaar  BP Stable . Permissive hypertension (OK if < 180/110) but gradually normalize in 5-7 days . Long-term BP goal normotensive  Hyperlipidemia  Home Lipid lowering medication: lipitor 20  LDL 50, goal < 70  Current lipid lowering medication: lipitor 20  High intensity statin not indicated as below goal and advanced age  Diabetes  Home diabetic meds: Levemir, Novolog  HgbA1c 6.9, goal < 7.0  SSI   On levemir  CBG monitoring  Fever and leukocytosis  WBC 11.4 -> 10.2  Tmax - 101.1 -> afebrile  UA neg  blood culture pending  CXR Left base opacification likely effusion with atelectasis. Infection in the left base is possible.  Dysphagia . Secondary to stroke . Speech on board . On dys 1 diet with nectar thick . Pt intake not adequate . Continue NS @ 40   Other Stroke Risk Factors  Advanced age  Hx stroke/TIA - stroke 04/2019 without residue as per chart - no details  Active Problems   Acute blood loss anemia IR IR 13.3->11.4->11.5  Right hip pain - severe right hip osteoarthritis on XR  Right knee pain - tylenol PRN  Constipation - suppository and fleet enema   Hospital day # 5  Rosalin Hawking, MD PhD Stroke Neurology 08/05/2019 1:58 PM   To contact Stroke Continuity provider, please refer to [email or web address] After hours, contact General Neurology

## 2019-08-06 DIAGNOSIS — K5900 Constipation, unspecified: Secondary | ICD-10-CM | POA: Diagnosis not present

## 2019-08-06 DIAGNOSIS — I4891 Unspecified atrial fibrillation: Secondary | ICD-10-CM | POA: Diagnosis not present

## 2019-08-06 DIAGNOSIS — M79604 Pain in right leg: Secondary | ICD-10-CM | POA: Diagnosis present

## 2019-08-06 DIAGNOSIS — R338 Other retention of urine: Secondary | ICD-10-CM

## 2019-08-06 DIAGNOSIS — E785 Hyperlipidemia, unspecified: Secondary | ICD-10-CM | POA: Diagnosis present

## 2019-08-06 DIAGNOSIS — I10 Essential (primary) hypertension: Secondary | ICD-10-CM | POA: Diagnosis present

## 2019-08-06 DIAGNOSIS — R339 Retention of urine, unspecified: Secondary | ICD-10-CM | POA: Diagnosis not present

## 2019-08-06 DIAGNOSIS — E119 Type 2 diabetes mellitus without complications: Secondary | ICD-10-CM

## 2019-08-06 LAB — GLUCOSE, CAPILLARY
Glucose-Capillary: 15 mg/dL — CL (ref 70–99)
Glucose-Capillary: 113 mg/dL — ABNORMAL HIGH (ref 70–99)
Glucose-Capillary: 209 mg/dL — ABNORMAL HIGH (ref 70–99)
Glucose-Capillary: 254 mg/dL — ABNORMAL HIGH (ref 70–99)
Glucose-Capillary: 131 mg/dL — ABNORMAL HIGH (ref 70–99)

## 2019-08-06 LAB — BASIC METABOLIC PANEL
Anion gap: 12 (ref 5–15)
BUN: 16 mg/dL (ref 8–23)
CO2: 21 mmol/L — ABNORMAL LOW (ref 22–32)
Calcium: 8.7 mg/dL — ABNORMAL LOW (ref 8.9–10.3)
Chloride: 103 mmol/L (ref 98–111)
Creatinine, Ser: 0.83 mg/dL (ref 0.44–1.00)
GFR calc Af Amer: >60 mL/min (ref >60)
GFR calc non Af Amer: >60 mL/min (ref >60)
Glucose, Bld: 116 mg/dL — ABNORMAL HIGH (ref 70–99)
Potassium: 3.8 mmol/L (ref 3.5–5.1)
Sodium: 136 mmol/L (ref 135–145)

## 2019-08-06 LAB — CBC
HCT: 35.9 % — ABNORMAL LOW (ref 36.0–46.0)
Hemoglobin: 11.9 g/dL — ABNORMAL LOW (ref 12.0–15.0)
MCH: 30.4 pg (ref 26.0–34.0)
MCHC: 33.1 g/dL (ref 30.0–36.0)
MCV: 91.6 fL (ref 80.0–100.0)
Platelets: 223 10*3/uL (ref 150–400)
RBC: 3.92 MIL/uL (ref 3.87–5.11)
RDW: 12.7 % (ref 11.5–15.5)
WBC: 13.8 10*3/uL — ABNORMAL HIGH (ref 4.0–10.5)
nRBC: 0 % (ref 0.0–0.2)

## 2019-08-06 MED ORDER — SODIUM CHLORIDE 0.9% FLUSH
10.0000 mL | Freq: Two times a day (BID) | INTRAVENOUS | Status: DC
  Administered 2019-08-08 – 2019-08-09 (×2): 10 mL

## 2019-08-06 MED ORDER — SODIUM CHLORIDE 0.9% FLUSH: 10.0000 mL | INTRAVENOUS | Status: DC | PRN

## 2019-08-06 NOTE — Progress Notes (Signed)
Physical Therapy Treatment Patient Details Name: Veronica Holland MRN: [id number] DOB: [birth year] Today's Date: 08/06/2019    History of Present Illness 85 yo female s/p IR revasularization of R MCA with CT (+) R MCA occlusio. Head CT 12/24-Evolving acute infarction of the right basal ganglia and adjacent white matter with hyperdensity reflecting contrast staining and/orhemorrhage. Additional evolving acute infarction of the anterior right temporal lobe. PMH :  stroke, DM    PT Comments    Pt has noticeably improved in general and L sided attention.  L side is mobile, weak and uncoordinated.  Emphasis on sit to stand and pre-gait activity.    Follow Up Recommendations  Supervision/Assistance - 24 hour;SNF;Other (comment)(pt doesn't have set destination or 24* assist at this point.)     Equipment Recommendations  Other (comment)(TBA)    Recommendations for Other Services Rehab consult     Precautions / Restrictions Precautions Precautions: Fall    Mobility  Bed Mobility               General bed mobility comments: OOB on arrival  Transfers Overall transfer level: Needs assistance Equipment used: Rolling walker (2 wheeled) Transfers: Sit to/from Stand Sit to Stand: Max assist         General transfer comment: use bil UE's to push up.  Assisted to come both forward and boost.  Ambulation/Gait             General Gait Details: Unable.   Stairs             Wheelchair Mobility    Modified Rankin (Stroke Patients Only) Modified Rankin (Stroke Patients Only) Pre-Morbid Rankin Score: No symptoms Modified Rankin: Severe disability     Balance Overall balance assessment: Needs assistance Sitting-balance support: Single extremity supported;Feet supported Sitting balance-Leahy Scale: Poor Sitting balance - Comments: mild L Lateral lean.  pt is inattentive to the L side, but has improved from L neglect.   Standing balance support: Bilateral upper  extremity supported Standing balance-Leahy Scale: Zero Standing balance comment: stood into the RW.  Worked on pre gait including w/shift, upright stance and L knee stability.                            Cognition Arousal/Alertness: Awake/alert Behavior During Therapy: WFL for tasks assessed/performed Overall Cognitive Status: No family/caregiver present to determine baseline cognitive functioning                                        Exercises      General Comments        Pertinent Vitals/Pain Pain Assessment: No/denies pain    Home Living                      Prior Function            PT Goals (current goals can now be found in the care plan section) Acute Rehab PT Goals Patient Stated Goal: to get rehab PT Goal Formulation: With patient Time For Goal Achievement: 08/15/19 Potential to Achieve Goals: Good Progress towards PT goals: Progressing toward goals    Frequency    Min 3X/week      PT Plan Discharge plan needs to be updated;Frequency needs to be updated    Co-evaluation  AM-PAC PT "6 Clicks" Mobility   Outcome Measure  Help needed turning from your back to your side while in a flat bed without using bedrails?: A Lot Help needed moving from lying on your back to sitting on the side of a flat bed without using bedrails?: Total Help needed moving to and from a bed to a chair (including a wheelchair)?: Total Help needed standing up from a chair using your arms (e.g., wheelchair or bedside chair)?: Total Help needed to walk in hospital room?: Total Help needed climbing 3-5 steps with a railing? : Total 6 Click Score: 7    End of Session   Activity Tolerance: Patient tolerated treatment well;Patient limited by fatigue Patient left: in chair;with call bell/phone within reach;with chair alarm set;with family/visitor present Nurse Communication: Mobility status PT Visit Diagnosis: Hemiplegia and  hemiparesis;Other abnormalities of gait and mobility (R26.89) Hemiplegia - Right/Left: Left Hemiplegia - dominant/non-dominant: Non-dominant Hemiplegia - caused by: Cerebral infarction     Time: 8206-0156 PT Time Calculation (min) (ACUTE ONLY): 39 min  Charges:  $Therapeutic Activity: 23-37 mins $Neuromuscular Re-education: 8-22 mins                     08/06/2019  Ginger Carne., PT Acute Rehabilitation Services [phone]  (pager) [phone]  (office)   [name] 08/06/2019, 6:54 PM

## 2019-08-06 NOTE — Progress Notes (Addendum)
  CSW unable to complete SNF assessment  At this time. CSW contacted patient's daughter, Mardene Celeste and left voice message to contact CSW.  CSW contacted [facility] - they are not accepting new admissions.   Thurmond Butts, MSW, [place] Clinical Social Worker

## 2019-08-06 NOTE — Discharge Summary (Addendum)
Stroke Discharge Summary  Patient ID: Veronica Holland   MRN: [id number]      DOB: [birth year]  Date of Admission: 07/31/2019 Date of Discharge: 08/09/2019    Attending Physician:  Marvel Plan, MD, Stroke MD Consultant(s):   Roxanne Gates) Corliss Skains, MD (Interventional Neuroradiologist), Maryla Morrow, MD (Physical Medicine & Rehabtilitation) Patient's PCP:  Patient, No Pcp Per  Discharge Diagnoses:  Principal Problem:   Multiple right-sided nontraumatic localized intracerebral hemorrhages (HCC) Active Problems:   Acute left-sided weakness   Middle cerebral artery embolism, right   Dysphagia, post-stroke   Controlled type 2 diabetes mellitus with hyperglycemia, without long-term current use of insulin (HCC)   History of CVA (cerebrovascular accident)   Acute blood loss anemia   Atrial fibrillation (HCC)   Essential hypertension   Hyperlipidemia   Diabetes mellitus type II, controlled (HCC)   Urinary retention   Right hip and leg pain   Constipation  Past Surgical History:  Procedure Laterality Date  . RADIOLOGY WITH ANESTHESIA N/A 07/31/2019   Procedure: IR WITH ANESTHESIA;  Surgeon: Julieanne Cotton, MD;  Location: [facility] OR;  Service: Radiology;  Laterality: N/A;    Medications to be continued on Rehab Allergies as of 08/09/2019      Reactions   Bee Venom Other (See Comments)   Hives Hives   Penicillin G Other (See Comments), Rash   Unknown Unknown      Medication List    STOP taking these medications   clopidogrel 75 MG tablet Commonly known as: PLAVIX   flecainide 50 MG tablet Commonly known as: TAMBOCOR   insulin aspart 100 UNIT/ML injection Commonly known as: novoLOG   lovastatin 20 MG tablet Commonly known as: MEVACOR   triamterene-hydrochlorothiazide 37.5-25 MG capsule Commonly known as: DYAZIDE     TAKE these medications   amiodarone 200 MG tablet Commonly known as: PACERONE Take 200 mg by mouth daily.   aspirin 325 MG EC tablet Take 1 tablet  (325 mg total) by mouth daily. Be sure to stop aspirin once started on anticoagulation 10-14 days post stroke What changed:   medication strength  how much to take  additional instructions   atorvastatin 20 MG tablet Commonly known as: LIPITOR Take 20 mg by mouth daily.   calcium-vitamin D 500-200 MG-UNIT tablet Commonly known as: OSCAL WITH D Take 1 tablet by mouth daily with breakfast.   insulin detemir 100 UNIT/ML injection Commonly known as: LEVEMIR Inject 25 Units into the skin daily.   latanoprost 0.005 % ophthalmic solution Commonly known as: XALATAN Place 1 drop into both eyes at bedtime.   losartan 25 MG tablet Commonly known as: COZAAR Take 25 mg by mouth daily.       LABORATORY STUDIES CBC    Component Value Date/Time   WBC 10.1 08/08/2019 0637   RBC 3.75 (L) 08/08/2019 0637   HGB 11.3 (L) 08/08/2019 0637   HCT 35.1 (L) 08/08/2019 0637   PLT 291 08/08/2019 0637   MCV 93.6 08/08/2019 0637   MCH 30.1 08/08/2019 0637   MCHC 32.2 08/08/2019 0637   RDW 12.6 08/08/2019 0637   LYMPHSABS 0.8 08/01/2019 0222   MONOABS 0.4 08/01/2019 0222   EOSABS 0.0 08/01/2019 0222   BASOSABS 0.0 08/01/2019 0222   CMP    Component Value Date/Time   NA 140 08/08/2019 0637   K 4.2 08/08/2019 0637   CL 107 08/08/2019 0637   CO2 24 08/08/2019 0637   GLUCOSE 97 08/08/2019 0637   BUN  17 08/08/2019 0637   CREATININE 0.86 08/08/2019 0637   CALCIUM 8.7 (L) 08/08/2019 0637   PROT 6.6 07/31/2019 2133   ALBUMIN 3.7 07/31/2019 2133   AST 19 07/31/2019 2133   ALT 15 07/31/2019 2133   ALKPHOS 61 07/31/2019 2133   BILITOT 1.3 (H) 07/31/2019 2133   GFRNONAA 59 (L) 08/08/2019 0637   GFRAA >60 08/08/2019 0637   COAGS Lab Results  Component Value Date   INR 1.0 07/31/2019   Lipid Panel    Component Value Date/Time   CHOL 99 08/01/2019 0355   TRIG 34 08/01/2019 0355   HDL 42 08/01/2019 0355   CHOLHDL 2.4 08/01/2019 0355   VLDL 7 08/01/2019 0355   LDLCALC 50 08/01/2019  0355   HgbA1C  Lab Results  Component Value Date   HGBA1C 6.9 (H) 08/01/2019   Urinalysis    Component Value Date/Time   COLORURINE YELLOW 08/04/2019 1655   APPEARANCEUR CLEAR 08/04/2019 1655   LABSPEC 1.014 08/04/2019 1655   PHURINE 7.0 08/04/2019 1655   GLUCOSEU 150 (A) 08/04/2019 1655   HGBUR NEGATIVE 08/04/2019 1655   BILIRUBINUR NEGATIVE 08/04/2019 1655   KETONESUR NEGATIVE 08/04/2019 1655   PROTEINUR 30 (A) 08/04/2019 1655   NITRITE NEGATIVE 08/04/2019 1655   LEUKOCYTESUR NEGATIVE 08/04/2019 1655   Urine Drug Screen     Component Value Date/Time   LABOPIA NONE DETECTED 07/31/2019 2227   COCAINSCRNUR NONE DETECTED 07/31/2019 2227   LABBENZ NONE DETECTED 07/31/2019 2227   AMPHETMU NONE DETECTED 07/31/2019 2227   THCU NONE DETECTED 07/31/2019 2227   LABBARB NONE DETECTED 07/31/2019 2227    Alcohol Level    Component Value Date/Time   ETH <10 08/01/2019 0537     SIGNIFICANT DIAGNOSTIC STUDIES CT Code Stroke CTA Head W/WO contrast  Result Date: 07/31/2019 CLINICAL DATA:  Stroke.  Left-sided weakness EXAM: CT ANGIOGRAPHY HEAD AND NECK CT PERFUSION BRAIN TECHNIQUE: Multidetector CT imaging of the head and neck was performed using the standard protocol during bolus administration of intravenous contrast. Multiplanar CT image reconstructions and MIPs were obtained to evaluate the vascular anatomy. Carotid stenosis measurements (when applicable) are obtained utilizing NASCET criteria, using the distal internal carotid diameter as the denominator. Multiphase CT imaging of the brain was performed following IV bolus contrast injection. Subsequent parametric perfusion maps were calculated using RAPID software. CONTRAST:  OMNIPAQUE IOHEXOL 350 MG/ML SOLN 100 mL Isovue 370 IV COMPARISON:  CT head 07/31/2019 FINDINGS: Aortic arch: Standard branching. Imaged portion shows no evidence of aneurysm or dissection. No significant stenosis of the major arch vessel origins.  Atherosclerotic disease in the aortic arch. Atherosclerotic plaque proximal left subclavian artery with approximately 50% diameter stenosis proximal to the vertebral artery. Right carotid system: Atherosclerotic calcification at the right carotid bifurcation without significant stenosis. Left carotid system: Atherosclerotic calcification left carotid bifurcation. Approximately 50% diameter stenosis proximal left internal carotid artery. Vertebral arteries: Right vertebral artery dominant. Both vertebral arteries patent to the basilar. Mild calcific stenosis at the origin of the vertebral bilaterally. Skeleton: Cervical spondylosis.  No acute skeletal abnormality. Other neck: 7 x 12 mm right thyroid nodule. No adenopathy in the neck. Upper chest: Lung apices clear bilaterally. Review of the MIP images confirms the above findings CTA HEAD FINDINGS Anterior circulation: Atherosclerotic calcification right cavernous carotid which is patent without significant stenosis. Thrombus in the terminal right internal carotid artery extending into the right M1 and proximal right A1 segments. There is flow distal to the right M1 occlusion with decreased enhancement  of right MCA branches. Right anterior cerebral artery patent supplied from the left Left cavernous carotid without significant stenosis. Diffuse atherosclerotic calcification. Left anterior and middle cerebral arteries patent bilaterally without stenosis. Posterior circulation: Both vertebral arteries patent to the basilar. PICA patent bilaterally. Basilar widely patent. Superior cerebellar and posterior cerebral arteries patent bilaterally without significant stenosis or occlusion. Venous sinuses: Limited venous contrast due to our field of arterial phase scanning. Anatomic variants: None CT Brain Perfusion Findings: ASPECTS: 9 CBF (<30%) Volume: 8mL Perfusion (Tmax>6.0s) volume: Mismatch Volume: 95mL Infarction Location:Core infarct right basal ganglia. Penumbra  in the right temporal and parietal lobe in the right MCA territory. IMPRESSION: 1. 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 There is collateral circulation with decreased perfusion in the right MCA territory. 2. CT perfusion demonstrates 8 mL of core infarct in the right basal ganglia. 95 mL of penumbra in the right temporoparietal lobe. 3. 50% diameter stenosis proximal left subclavian artery. 4. Right carotid atherosclerotic disease without significant stenosis. 50% diameter stenosis proximal left internal carotid artery. 5. These results were called by telephone at the time of interpretation on 07/31/2019 at 10:11 pm to provider ERIC [facility] , who verbally acknowledged these results. Electronically Signed   By: Marlan Palau M.D.   On: 07/31/2019 22:12   DG Knee 1-2 Views Right  Result Date: 08/03/2019 CLINICAL DATA:  Increasing knee pain aggravated by physical activity today. No acute injury. EXAM: RIGHT KNEE - 1-2 VIEW COMPARISON:  Radiographs 05/11/2013. FINDINGS: The mineralization and alignment are normal. There is no evidence of acute fracture or dislocation. 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 No significant joint effusion. Mild femoral atherosclerosis. IMPRESSION: No acute osseous findings. 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 Electronically Signed   By: Carey Bullocks M.D.   On: 08/03/2019 16:46   CT HEAD WO CONTRAST  Result Date: 08/09/2019 CLINICAL DATA:  Stroke follow-up EXAM: CT HEAD WITHOUT CONTRAST TECHNIQUE: Contiguous axial images were obtained from the base of the skull through the vertex without intravenous contrast. COMPARISON:  08/02/2019 FINDINGS: Brain: Cytotoxic edema in the right basal ganglia and unusually involving the medial right temporal, attributed to stroke  by prior workup. Stable petechial hemorrhage in the right putamen. Decreasing hemorrhage in the right caudate and temporal lobe. No new hemorrhage or progressive low-density. There is mild local mass effect without midline shift, effacement of the right lateral ventricle improved. Vascular: Atherosclerotic calcification Skull: Negative Sinuses/Orbits: Unremarkable IMPRESSION: Decreasing petechial hemorrhage and swelling.  No new abnormality. Electronically Signed   By: Marnee Spring M.D.   On: 08/09/2019 07:09   CT HEAD WO CONTRAST  Result Date: 08/02/2019 CLINICAL DATA:  Right MCA occlusion post tPA and thrombectomy EXAM: CT HEAD WITHOUT CONTRAST TECHNIQUE: Contiguous axial images were obtained from the base of the skull through the vertex without intravenous contrast. COMPARISON:  07/31/2019 FINDINGS: Brain: There is hyperdensity with surrounding hypoattenuation of the right lentiform and caudate nuclei and adjacent white matter compatible with evolving acute infarction with superimposed contrast staining or reperfusion hemorrhage. There is partial effacement of the right lateral ventricle with trace leftward midline shift. There is no evidence of trapping. Additional hypoattenuation is present in the anterior right temporal lobe reflecting additional evolving infarction. Patchy hypoattenuation in the supratentorial white matter likely reflects superimposed chronic microvascular ischemic changes. Vascular: Resolution of right MCA hyperdensity. Skull:  Calvarium is unremarkable. Sinuses/Orbits: No acute finding. Other: None. IMPRESSION: Evolving acute infarction of the right basal ganglia and adjacent white matter with hyperdensity reflecting contrast staining and/or hemorrhage. Additional evolving acute infarction of the anterior right temporal lobe. Mild mass effect is present. Electronically Signed   By: Guadlupe Spanish M.D.   On: 08/02/2019 09:46   CT Code Stroke CTA Neck W/WO contrast  Result Date:  07/31/2019 CLINICAL DATA:  Stroke.  Left-sided weakness EXAM: CT ANGIOGRAPHY HEAD AND NECK CT PERFUSION BRAIN TECHNIQUE: Multidetector CT imaging of the head and neck was performed using the standard protocol during bolus administration of intravenous contrast. Multiplanar CT image reconstructions and MIPs were obtained to evaluate the vascular anatomy. Carotid stenosis measurements (when applicable) are obtained utilizing NASCET criteria, using the distal internal carotid diameter as the denominator. Multiphase CT imaging of the brain was performed following IV bolus contrast injection. Subsequent parametric perfusion maps were calculated using RAPID software. CONTRAST:  OMNIPAQUE IOHEXOL 350 MG/ML SOLN 100 mL Isovue 370 IV COMPARISON:  CT head 07/31/2019 FINDINGS: Aortic arch: Standard branching. Imaged portion shows no evidence of aneurysm or dissection. No significant stenosis of the major arch vessel origins. Atherosclerotic disease in the aortic arch. Atherosclerotic plaque proximal left subclavian artery with approximately 50% diameter stenosis proximal to the vertebral artery. Right carotid system: Atherosclerotic calcification at the right carotid bifurcation without significant stenosis. Left carotid system: Atherosclerotic calcification left carotid bifurcation. Approximately 50% diameter stenosis proximal left internal carotid artery. Vertebral arteries: Right vertebral artery dominant. Both vertebral arteries patent to the basilar. Mild calcific stenosis at the origin of the vertebral bilaterally. Skeleton: Cervical spondylosis.  No acute skeletal abnormality. Other neck: 7 x 12 mm right thyroid nodule. No adenopathy in the neck. Upper chest: Lung apices clear bilaterally. Review of the MIP images confirms the above findings CTA HEAD FINDINGS Anterior circulation: Atherosclerotic calcification right cavernous carotid which is patent without significant stenosis. Thrombus in the terminal right  internal carotid artery extending into the right M1 and proximal right A1 segments. There is flow distal to the right M1 occlusion with decreased enhancement of right MCA branches. Right anterior cerebral artery patent supplied from the left Left cavernous carotid without significant stenosis. Diffuse atherosclerotic calcification. Left anterior and middle cerebral arteries patent bilaterally without stenosis. Posterior circulation: Both vertebral arteries patent to the basilar. PICA patent bilaterally. Basilar widely patent. Superior cerebellar and posterior cerebral arteries patent bilaterally without significant stenosis or occlusion. Venous sinuses: Limited venous contrast due to our field of arterial phase scanning. Anatomic variants: None CT Brain Perfusion Findings: ASPECTS: 9 CBF (<30%) Volume: 8mL Perfusion (Tmax>6.0s) volume: Mismatch Volume: 95mL Infarction Location:Core infarct right basal ganglia. Penumbra in the right temporal and parietal lobe in the right MCA territory. IMPRESSION: 1. 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 There is collateral circulation with decreased perfusion in the right MCA territory. 2. CT perfusion demonstrates 8 mL of core infarct in the right basal ganglia. 95 mL of penumbra in the right temporoparietal lobe. 3. 50% diameter stenosis proximal left subclavian artery. 4. Right carotid atherosclerotic disease without significant stenosis. 50% diameter stenosis proximal left internal carotid artery. 5. These results were called by telephone at the time of interpretation on 07/31/2019 at 10:11 pm to provider ERIC [facility] , who verbally acknowledged these results. Electronically Signed   By: Marlan Palau M.D.   On: 07/31/2019 22:12   MR BRAIN WO CONTRAST  Result Date: 08/01/2019 CLINICAL DATA:  Stroke.  Endovascular thrombectomy 07/31/2019 EXAM: MRI HEAD WITHOUT CONTRAST TECHNIQUE: Multiplanar,  multiecho pulse sequences of the brain and surrounding structures were obtained without intravenous contrast. COMPARISON:  CT a head and neck 07/31/2019 FINDINGS: Brain: Acute hemorrhagic infarction in the right caudate and right putamen. Small amount of acute infarct in the anterior limb internal capsule on the right. Small acute infarcts in the right frontal white matter and right occipital lobe. 2 cm acute infarct in the right medial temporal lobe. Ventricle size normal. No midline shift. Mild mass-effect on the right lateral ventricle due to acute infarcts in the right basal ganglia. Mild chronic ischemic change in the white matter and pons. Chronic hemorrhage in the left frontal lobe. Vascular: Normal arterial flow voids.  Normal flow void right MCA. Skull and upper cervical spine: No focal skeletal lesion. Sinuses/Orbits: Mild mucosal edema paranasal sinuses. Bilateral cataract surgery Other: None IMPRESSION: Acute hemorrhagic infarction in the right putamen and right caudate. Acute infarct extends into the right temporal lobe. Small areas of acute infarct right frontal and right occipital lobe Mild chronic microvascular ischemic change in the white matter. Electronically Signed   By: Marlan Palau M.D.   On: 08/01/2019 14:28   IR CT Head Ltd  Result Date: 08/01/2019 INDICATION: New onset of right-sided gaze deviation, left sided weakness.  CT angiogram revealing occlusion of the right internal carotid artery terminus, the right middle cerebral artery and right anterior cerebral artery proximally.  EXAM: 1. EMERGENT LARGE VESSEL OCCLUSION THROMBOLYSIS (anterior CIRCULATION)  COMPARISON:  CT angiogram of the head and neck of July 31, 2019.  MEDICATIONS: Ancef 2 g IV antibiotic was administered within 1 hour of the procedure.  ANESTHESIA/SEDATION: General anesthesia.  CONTRAST:  Isovue 300 approximately 75 mL.  FLUOROSCOPY TIME:  Fluoroscopy Time: 26 minutes 0 seconds (1459 mGy).   COMPLICATIONS: None immediate.  TECHNIQUE: Following a full explanation of the procedure along with the potential associated complications, an informed witnessed consent was obtained. The risks of intracranial hemorrhage of 10%, worsening neurological deficit, ventilator dependency, death and inability to revascularize were all reviewed in detail with the patient's daughter.  The patient was then put under general anesthesia by the Department of Anesthesiology at [facility].  The right groin was prepped and draped in the usual sterile fashion. Thereafter using modified Seldinger technique, transfemoral access into the right common femoral artery was obtained without difficulty. Over a 0.035 inch guidewire a 5 French Pinnacle sheath was inserted. Through this, and also over a 0.035 inch guidewire a 5 [place] JB 1 catheter was advanced to the aortic arch region and selectively positioned in the innominate artery and the right common carotid artery, and left common carotid artery.  FINDINGS: The innominate artery injection reveals the origins of the right common carotid artery and the right subclavian artery to be widely patent.  The right vertebral artery origin is mildly narrowed. The vessel, otherwise, opacifies to the cranial skull base. Visualization of the right vertebrobasilar junction and partially the right posterior-inferior cerebellar artery and the basilar artery is seen on the lateral projection.  The right common carotid arteriogram demonstrates the right external carotid artery and its major branches to be widely patent.  The right internal carotid artery at the bulb to the cranial skull base demonstrates wide patency. The petrous, and the cavernous segments are widely patent.  The right posterior communicating artery is seen opacifying the right posterior cerebral distribution.  There is complete  angiographic occlusion just distal to this.  The left common carotid arteriogram  demonstrates the left external carotid artery and its major branches to be widely patent.  The left internal carotid artery at the bulb demonstrates approximately 60% stenosis at the bulb secondary to a smooth noncalcified plaque.  No evidence of ulcerations or of intraluminal filling defects is seen.  More distally, the left internal carotid artery is seen to opacify to the cranial skull base.  The petrous, cavernous and supraclinoid segments are widely patent.  Transient opacification of the left posterior communicating artery is seen.  The left middle cerebral artery and the left anterior cerebral artery opacify into the capillary and venous phases.  PROCEDURE: The diagnostic JB 1 catheter in the right common carotid artery was then exchanged over a 0.035 inch 300 cm Rosen exchange guidewire for an 8 [place] Pinnacle sheath in the right groin which was connected to continuous heparinized saline infusion. Over the [employer] guidewire, a Walrus 097, 95 cm balloon guide catheter which had been prepped with 50% contrast and 50% heparinized saline infusion was advanced and positioned into the distal right internal carotid artery. The guidewire was removed. Good aspiration obtained from the hub of the balloon guide catheter. A gentle contrast injection demonstrated no change in the intracranial circulation.  Over a 0.014 inch standard Synchro micro guidewire with a J configuration, an 021 Trevo ProVue microcatheter inside of a 6 French 132 cm Catalyst guide catheter was advanced to the cavernous right ICA.  The micro guidewire was then gently manipulated with a torque device and advanced into the distal M2 M3 inferior division. This was then followed by the microcatheter. The guidewire was removed. Good aspiration obtained from the hub of the microcatheter. Gentle contrast injection revealed a safe tip of the microcatheter which was then connected to continuous heparinized saline infusion.  At this time,  the 6 [place] Catalyst guide catheter was advanced to the proximal portion of the occluded supraclinoid right ICA. A Solitaire 4 mm x 40 mm X retrieval device was then advanced to the distal end of the microcatheter. The O ring on the delivery microcatheter was then loosened. With slight forward gentle traction with the right hand on the O ring, the delivery microcatheter was loosened. With slight forward traction with the right hand on the delivery micro guidewire, the delivery microcatheter was retrieved unsheathing the retrieval device.  With constant aspiration at the hub of the Catalyst guide catheter using a Penumbra aspiration device, and proximal flow arrest of the right internal carotid artery by inflating the balloon, and constant aspiration being applied with a 60 mL syringe at the hub of the balloon guide catheter for approximately 2 minutes, the combination of the retrieval device, the microcatheter and the 6 [place] Catalyst guide catheter were retrieved and removed. Flow arrest was reversed. Free aspiration of blood was seen at the hub of the [facility] balloon guide catheter. A control arteriogram performed through the balloon guide catheter in the right internal carotid artery now demonstrated complete angiographic revascularization of the right internal carotid artery terminus, the right MCA MCA distribution and the right anterior cerebral distribution. No evidence of intraluminal filling defects were seen. A TICI 3 revascularization was achieved.  Also no evidence of extravasation or mass-effect was seen. The right posterior communicating artery was widely patent.  The Walrus balloon guide catheter was retrieved and removed. The 8 French Pinnacle sheath in the right common femoral artery was removed and hemostasis was achieved with  an 8 [place] Angio-Seal closure device. The right groin appeared soft. Distal pulses remained Dopplerable in the dorsalis pedis, and the posterior tibial arteries  bilaterally unchanged.  The patient's flat panel CT of the brain revealed no evidence of intracranial hemorrhage, mass effect or midline shift.  The patient's general anesthesia was then reversed. Upon recovery, the patient was able to respond to simple commands appropriately and was able to lift her left arm and left leg against gravity.  She was then transferred to the neuro ICU to continue with post thrombectomy management.  IMPRESSION: Status post endovascular complete revascularization of the right internal carotid artery terminus, right middle cerebral artery and right anterior cerebral distribution with 1 pass with the 4 mm x 40 mm Solitaire X retrieval device with Penumbra aspiration achieving a TICI 3 revascularization.  PLAN: Follow-up in the clinic 4 weeks post discharge.   Electronically Signed   By: Julieanne Cotton M.D.   On: 08/03/2019 09:26   CT Code Stroke Cerebral Perfusion with contrast  Result Date: 07/31/2019 CLINICAL DATA:  Stroke.  Left-sided weakness EXAM: CT ANGIOGRAPHY HEAD AND NECK CT PERFUSION BRAIN TECHNIQUE: Multidetector CT imaging of the head and neck was performed using the standard protocol during bolus administration of intravenous contrast. Multiplanar CT image reconstructions and MIPs were obtained to evaluate the vascular anatomy. Carotid stenosis measurements (when applicable) are obtained utilizing NASCET criteria, using the distal internal carotid diameter as the denominator. Multiphase CT imaging of the brain was performed following IV bolus contrast injection. Subsequent parametric perfusion maps were calculated using RAPID software. CONTRAST:  OMNIPAQUE IOHEXOL 350 MG/ML SOLN 100 mL Isovue 370 IV COMPARISON:  CT head 07/31/2019 FINDINGS: Aortic arch: Standard branching. Imaged portion shows no evidence of aneurysm or dissection. No significant stenosis of the major arch vessel origins. Atherosclerotic disease in the aortic arch. Atherosclerotic plaque  proximal left subclavian artery with approximately 50% diameter stenosis proximal to the vertebral artery. Right carotid system: Atherosclerotic calcification at the right carotid bifurcation without significant stenosis. Left carotid system: Atherosclerotic calcification left carotid bifurcation. Approximately 50% diameter stenosis proximal left internal carotid artery. Vertebral arteries: Right vertebral artery dominant. Both vertebral arteries patent to the basilar. Mild calcific stenosis at the origin of the vertebral bilaterally. Skeleton: Cervical spondylosis.  No acute skeletal abnormality. Other neck: 7 x 12 mm right thyroid nodule. No adenopathy in the neck. Upper chest: Lung apices clear bilaterally. Review of the MIP images confirms the above findings CTA HEAD FINDINGS Anterior circulation: Atherosclerotic calcification right cavernous carotid which is patent without significant stenosis. Thrombus in the terminal right internal carotid artery extending into the right M1 and proximal right A1 segments. There is flow distal to the right M1 occlusion with decreased enhancement of right MCA branches. Right anterior cerebral artery patent supplied from the left Left cavernous carotid without significant stenosis. Diffuse atherosclerotic calcification. Left anterior and middle cerebral arteries patent bilaterally without stenosis. Posterior circulation: Both vertebral arteries patent to the basilar. PICA patent bilaterally. Basilar widely patent. Superior cerebellar and posterior cerebral arteries patent bilaterally without significant stenosis or occlusion. Venous sinuses: Limited venous contrast due to our field of arterial phase scanning. Anatomic variants: None CT Brain Perfusion Findings: ASPECTS: 9 CBF (<30%) Volume: 8mL Perfusion (Tmax>6.0s) volume: Mismatch Volume: 95mL Infarction Location:Core infarct right basal ganglia. Penumbra in the right temporal and parietal lobe in the right MCA  territory. IMPRESSION: 1. 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 There is collateral circulation with decreased perfusion in the right MCA territory. 2. CT perfusion demonstrates 8 mL of core infarct in the right basal ganglia. 95 mL of penumbra in the right temporoparietal lobe. 3. 50% diameter stenosis proximal left subclavian artery. 4. Right carotid atherosclerotic disease without significant stenosis. 50% diameter stenosis proximal left internal carotid artery. 5. These results were called by telephone at the time of interpretation on 07/31/2019 at 10:11 pm to provider ERIC [facility] , who verbally acknowledged these results. Electronically Signed   By: Marlan Palau M.D.   On: 07/31/2019 22:12   DG CHEST PORT 1 VIEW  Result Date: 08/04/2019 CLINICAL DATA:  Fever and leukocytosis. EXAM: PORTABLE CHEST 1 VIEW COMPARISON:  06/13/2017 FINDINGS: Lungs are hypoinflated with subtle hazy interstitial prominence bilaterally. There is opacification over the left base with obscuration of the hemidiaphragm likely left effusion with associated basilar atelectasis. Cardiomediastinal silhouette and remainder the exam is unchanged. IMPRESSION: Left base opacification likely effusion with atelectasis. Infection in the left base is possible. Subtle hazy interstitial prominence which may be due to edema or infection. Electronically Signed   By: Elberta Fortis M.D.   On: 08/04/2019 10:13   DG Shoulder Left  Result Date: 08/03/2019 CLINICAL DATA:  Left shoulder pain.  Acute hemorrhagic infarct. EXAM: LEFT SHOULDER - 2+ VIEW COMPARISON:  None. FINDINGS: There is no evidence of fracture or dislocation. There is no evidence of arthropathy or other focal bone abnormality. Soft tissues are unremarkable. IMPRESSION: Negative left shoulder radiographs. Electronically Signed   By: Marin Roberts M.D.   On: 08/03/2019 16:45   ECHOCARDIOGRAM  COMPLETE  Result Date: 08/01/2019   ECHOCARDIOGRAM REPORT   Patient Name:   Adeli Gwinner Date of Exam: 08/01/2019 Medical Rec #:  [id number]    Height:       64.0 in Accession #:    [id number]   Weight:       149.9 lb Date of Birth:  01-27-1925     BSA:          1.73 m Patient Age:    89 years     BP:           116/63 mmHg Patient Gender: F            HR:           65 bpm. Exam Location:  Inpatient Procedure: 2D Echo, Cardiac Doppler, Color Doppler and Intracardiac            Opacification Agent Indications:    Stroke 434.91  History:        Patient has no prior history of Echocardiogram examinations.                 Stroke; Risk Factors:Non-Smoker.  Sonographer:    Tonia Ghent RDCS Referring Phys: [id number] ERIC LINDZEN IMPRESSIONS  1. Left ventricular ejection fraction, by visual estimation, is 55 to 60%. The left ventricle has normal function. There is no left ventricular hypertrophy.  2. Definity contrast agent was given IV to delineate the left ventricular endocardial borders.  3. Elevated left atrial pressure.  4. Left ventricular diastolic parameters are consistent with Grade II diastolic dysfunction (pseudonormalization).  5. Global right ventricle has normal systolic function.The right ventricular size is normal.  6. Left atrial size was normal.  7. Right atrial size was normal.  8. Mild mitral annular calcification.  9. The mitral valve is normal in structure. Trivial mitral valve regurgitation. No evidence of mitral  stenosis. 10. The tricuspid valve is normal in structure. 11. The aortic valve is tricuspid. Aortic valve regurgitation is not visualized. Mild aortic valve sclerosis without stenosis. 12. The pulmonic valve was normal in structure. Pulmonic valve regurgitation is trivial. 13. The inferior vena cava is normal in size with greater than 50% respiratory variability, suggesting right atrial pressure of 3 mmHg. 14. Definity used; normal LV systolic function; grade 2 diastolic dysfunction. FINDINGS   Left Ventricle: Left ventricular ejection fraction, by visual estimation, is 55 to 60%. The left ventricle has normal function. Definity contrast agent was given IV to delineate the left ventricular endocardial borders. The left ventricle is not well visualized. There is no left ventricular hypertrophy. Left ventricular diastolic parameters are consistent with Grade II diastolic dysfunction (pseudonormalization). Elevated left atrial pressure. Right Ventricle: The right ventricular size is normal.Global RV systolic function is has normal systolic function. Left Atrium: Left atrial size was normal in size. Right Atrium: Right atrial size was normal in size Pericardium: There is no evidence of pericardial effusion. Mitral Valve: The mitral valve is normal in structure. Mild mitral annular calcification. Trivial mitral valve regurgitation. No evidence of mitral valve stenosis by observation. Tricuspid Valve: The tricuspid valve is normal in structure. Tricuspid valve regurgitation is trivial. Aortic Valve: The aortic valve is tricuspid. Aortic valve regurgitation is not visualized. Mild aortic valve sclerosis is present, with no evidence of aortic valve stenosis. Pulmonic Valve: The pulmonic valve was normal in structure. Pulmonic valve regurgitation is trivial. Pulmonic regurgitation is trivial. Aorta: The aortic root is normal in size and structure. Venous: The inferior vena cava was not well visualized. The inferior vena cava is normal in size with greater than 50% respiratory variability, suggesting right atrial pressure of 3 mmHg.  Additional Comments: Definity used; normal LV systolic function; grade 2 diastolic dysfunction.  LEFT VENTRICLE PLAX 2D LVIDd:         4.52 cm       Diastology LVIDs:         3.08 cm       LV e' lateral:   7.71 cm/s LV PW:         1.01 cm       LV E/e' lateral: 11.9 LV IVS:        1.00 cm       LV e' medial:    4.99 cm/s LVOT diam:     1.70 cm       LV E/e' medial:  18.4 LV SV:          56 ml LV SV Index:   31.76 LVOT Area:     2.27 cm  LV Volumes (MOD) LV area d, A2C:    28.90 cm LV area d, A4C:    29.00 cm LV area s, A2C:    16.60 cm LV area s, A4C:    17.90 cm LV major d, A2C:   7.84 cm LV major d, A4C:   7.49 cm LV major s, A2C:   6.31 cm LV major s, A4C:   6.49 cm LV vol d, MOD A2C: 88.8 ml LV vol d, MOD A4C: 93.2 ml LV vol s, MOD A2C: 38.0 ml LV vol s, MOD A4C: 41.7 ml LV SV MOD A2C:     50.8 ml LV SV MOD A4C:     93.2 ml LV SV MOD BP:      52.8 ml RIGHT VENTRICLE RV S prime:     9.20 cm/s TAPSE (M-mode): 1.8  cm LEFT ATRIUM           Index       RIGHT ATRIUM           Index LA diam:      3.50 cm 2.02 cm/m  RA Area:     13.20 cm LA Vol (A2C): 43.7 ml 25.25 ml/m RA Volume:   27.10 ml  15.66 ml/m LA Vol (A4C): 39.4 ml 22.76 ml/m  AORTIC VALVE LVOT Vmax:   75.70 cm/s LVOT Vmean:  53.200 cm/s LVOT VTI:    0.179 m  AORTA Ao Root diam: 2.80 cm MITRAL VALVE MV Area (PHT): 3.12 cm             SHUNTS MV PHT:        70.47 msec           Systemic VTI:  0.18 m MV Decel Time: 243 msec             Systemic Diam: 1.70 cm MV E velocity: 91.70 cm/s 103 cm/s MV A velocity: 46.70 cm/s 70.3 cm/s MV E/A ratio:  1.96       1.5  Olga Millers MD Electronically signed by Olga Millers MD Signature Date/Time: 08/01/2019/11:51:31 AM    Final    IR PERCUTANEOUS ART THROMBECTOMY/INFUSION INTRACRANIAL INC DIAG ANGIO  Result Date: 08/06/2019 INDICATION: New onset of right-sided gaze deviation, left sided weakness. CT angiogram revealing occlusion of the right internal carotid artery terminus, the right middle cerebral artery and right anterior cerebral artery proximally. EXAM: 1. EMERGENT LARGE VESSEL OCCLUSION THROMBOLYSIS (anterior CIRCULATION) COMPARISON:  CT angiogram of the head and neck of July 31, 2019. MEDICATIONS: Ancef 2 g IV antibiotic was administered within 1 hour of the procedure. ANESTHESIA/SEDATION: General anesthesia. CONTRAST:  Isovue 300 approximately 75 mL. FLUOROSCOPY TIME:   Fluoroscopy Time: 26 minutes 0 seconds (1459 mGy). COMPLICATIONS: None immediate. TECHNIQUE: Following a full explanation of the procedure along with the potential associated complications, an informed witnessed consent was obtained. The risks of intracranial hemorrhage of 10%, worsening neurological deficit, ventilator dependency, death and inability to revascularize were all reviewed in detail with the patient's daughter. The patient was then put under general anesthesia by the Department of Anesthesiology at [facility]. The right groin was prepped and draped in the usual sterile fashion. Thereafter using modified Seldinger technique, transfemoral access into the right common femoral artery was obtained without difficulty. Over a 0.035 inch guidewire a 5 French Pinnacle sheath was inserted. Through this, and also over a 0.035 inch guidewire a 5 [place] JB 1 catheter was advanced to the aortic arch region and selectively positioned in the innominate artery and the right common carotid artery, and left common carotid artery. FINDINGS: The innominate artery injection reveals the origins of the right common carotid artery and the right subclavian artery to be widely patent. The right vertebral artery origin is mildly narrowed. The vessel, otherwise, opacifies to the cranial skull base. Visualization of the right vertebrobasilar junction and partially the right posterior-inferior cerebellar artery and the basilar artery is seen on the lateral projection. The right common carotid arteriogram demonstrates the right external carotid artery and its major branches to be widely patent. The right internal carotid artery at the bulb to the cranial skull base demonstrates wide patency. The petrous, and the cavernous segments are widely patent. The right posterior communicating artery is seen opacifying the right posterior cerebral distribution. There is complete angiographic occlusion just distal to this. The left  common  carotid arteriogram demonstrates the left external carotid artery and its major branches to be widely patent. The left internal carotid artery at the bulb demonstrates approximately 60% stenosis at the bulb secondary to a smooth noncalcified plaque. No evidence of ulcerations or of intraluminal filling defects is seen. More distally, the left internal carotid artery is seen to opacify to the cranial skull base. The petrous, cavernous and supraclinoid segments are widely patent. Transient opacification of the left posterior communicating artery is seen. The left middle cerebral artery and the left anterior cerebral artery opacify into the capillary and venous phases. PROCEDURE: The diagnostic JB 1 catheter in the right common carotid artery was then exchanged over a 0.035 inch 300 cm Rosen exchange guidewire for an 8 [place] Pinnacle sheath in the right groin which was connected to continuous heparinized saline infusion. Over the Walt Disneyosen exchange guidewire, a Walrus 097, 95 cm balloon guide catheter which had been prepped with 50% contrast and 50% heparinized saline infusion was advanced and positioned into the distal right internal carotid artery. The guidewire was removed. Good aspiration obtained from the hub of the balloon guide catheter. A gentle contrast injection demonstrated no change in the intracranial circulation. Over a 0.014 inch standard Synchro micro guidewire with a J configuration, an 021 Trevo ProVue microcatheter inside of a 6 French 132 cm Catalyst guide catheter was advanced to the cavernous right ICA. The micro guidewire was then gently manipulated with a torque device and advanced into the distal M2 M3 inferior division. This was then followed by the microcatheter. The guidewire was removed. Good aspiration obtained from the hub of the microcatheter. Gentle contrast injection revealed a safe tip of the microcatheter which was then connected to continuous heparinized saline infusion. At this  time, the 6 [place] Catalyst guide catheter was advanced to the proximal portion of the occluded supraclinoid right ICA. A Solitaire 4 mm x 40 mm X retrieval device was then advanced to the distal end of the microcatheter. The O ring on the delivery microcatheter was then loosened. With slight forward gentle traction with the right hand on the O ring, the delivery microcatheter was loosened. With slight forward traction with the right hand on the delivery micro guidewire, the delivery microcatheter was retrieved unsheathing the retrieval device. With constant aspiration at the hub of the Catalyst guide catheter using a Penumbra aspiration device, and proximal flow arrest of the right internal carotid artery by inflating the balloon, and constant aspiration being applied with a 60 mL syringe at the hub of the balloon guide catheter for approximately 2 minutes, the combination of the retrieval device, the microcatheter and the 6 [place] Catalyst guide catheter were retrieved and removed. Flow arrest was reversed. Free aspiration of blood was seen at the hub of the [facility] balloon guide catheter. A control arteriogram performed through the balloon guide catheter in the right internal carotid artery now demonstrated complete angiographic revascularization of the right internal carotid artery terminus, the right MCA MCA distribution and the right anterior cerebral distribution. No evidence of intraluminal filling defects were seen. A TICI 3 revascularization was achieved. Also no evidence of extravasation or mass-effect was seen. The right posterior communicating artery was widely patent. The Walrus balloon guide catheter was retrieved and removed. The 8 French Pinnacle sheath in the right common femoral artery was removed and hemostasis was achieved with an 8 [place] Angio-Seal closure device. The right groin appeared soft. Distal pulses remained Dopplerable in the dorsalis pedis, and the posterior tibial arteries  bilaterally unchanged. The patient's flat panel CT of the brain revealed no evidence of intracranial hemorrhage, mass effect or midline shift. The patient's general anesthesia was then reversed. Upon recovery, the patient was able to respond to simple commands appropriately and was able to lift her left arm and left leg against gravity. She was then transferred to the neuro ICU to continue with post thrombectomy management. IMPRESSION: Status post endovascular complete revascularization of the right internal carotid artery terminus, right middle cerebral artery and right anterior cerebral distribution with 1 pass with the 4 mm x 40 mm Solitaire X retrieval device with Penumbra aspiration achieving a TICI 3 revascularization. PLAN: Follow-up in the clinic 4 weeks post discharge. Electronically Signed   By: Julieanne Cotton M.D.   On: 08/03/2019 09:26   DG HIP PORT UNILAT WITH PELVIS 1V RIGHT  Result Date: 08/04/2019 CLINICAL DATA:  85 year old with RIGHT hip pain of unspecified chronicity. EXAM: DG HIP (WITH OR WITHOUT PELVIS) 1V PORT RIGHT COMPARISON:  None. FINDINGS: No evidence of acute or subacute fracture or dislocation. Severe axial joint space narrowing with associated hypertrophic spurring involving the femoral head. Well-preserved bone mineral density for patient age. Included AP pelvis demonstrates moderate axial joint space narrowing involving the contralateral LEFT hip. Sacroiliac joints and symphysis pubis anatomically aligned without significant degenerative changes. Mild degenerative changes involving the visualized lower lumbar spine. IMPRESSION: 1. No acute or subacute osseous abnormality. 2. Severe osteoarthritis of the right hip. 3. Moderate osteoarthritis involving the contralateral LEFT hip. Electronically Signed   By: Hulan Saas M.D.   On: 08/04/2019 17:30   CT HEAD CODE STROKE WO CONTRAST  Result Date: 07/31/2019 CLINICAL DATA:  Code stroke.  Left-sided weakness. Slurred  speech. EXAM: CT HEAD WITHOUT CONTRAST TECHNIQUE: Contiguous axial images were obtained from the base of the skull through the vertex without intravenous contrast. COMPARISON:  CT head 05/18/2027 FINDINGS: Brain: Mild atrophy. Patchy white matter hypodensity bilaterally is unchanged. Mild asymmetric hypodensity right lateral basal ganglia. Insular cortex normal. Negative for hemorrhage. Vascular: Hyperdense right MCA. Skull: Negative Sinuses/Orbits: Paranasal sinuses clear.  Bilateral cataract surgery Other: None ASPECTS ([place] Stroke Program Early CT Score) - Ganglionic level infarction (caudate, lentiform nuclei, internal capsule, insula, M1-M3 cortex): 6 - Supraganglionic infarction (M4-M6 cortex): 3 Total score (0-10 with 10 being normal): 9 IMPRESSION: 1. Hyperdense right MCA compatible with acute thrombus. Probable early infarct right lateral basal ganglia 2. ASPECTS is 9 3. These results were called by telephone at the time of interpretation on 07/31/2019 at 9:41 pm to provider Dr.Lindzen , who verbally acknowledged these results. Electronically Signed   By: Marlan Palau M.D.   On: 07/31/2019 21:42   IR ANGIO INTRA EXTRACRAN SEL COM CAROTID INNOMINATE UNI L MOD SED  Result Date: 08/01/2019 INDICATION: New onset of right-sided gaze deviation, left sided weakness.  CT angiogram revealing occlusion of the right internal carotid artery terminus, the right middle cerebral artery and right anterior cerebral artery proximally.  EXAM: 1. EMERGENT LARGE VESSEL OCCLUSION THROMBOLYSIS (anterior CIRCULATION)  COMPARISON:  CT angiogram of the head and neck of July 31, 2019.  MEDICATIONS: Ancef 2 g IV antibiotic was administered within 1 hour of the procedure.  ANESTHESIA/SEDATION: General anesthesia.  CONTRAST:  Isovue 300 approximately 75 mL.  FLUOROSCOPY TIME:  Fluoroscopy Time: 26 minutes 0 seconds (1459 mGy).  COMPLICATIONS: None immediate.  TECHNIQUE: Following a full explanation of the  procedure along with the potential associated complications, an informed witnessed consent was obtained. The risks  of intracranial hemorrhage of 10%, worsening neurological deficit, ventilator dependency, death and inability to revascularize were all reviewed in detail with the patient's daughter.  The patient was then put under general anesthesia by the Department of Anesthesiology at [facility].  The right groin was prepped and draped in the usual sterile fashion. Thereafter using modified Seldinger technique, transfemoral access into the right common femoral artery was obtained without difficulty. Over a 0.035 inch guidewire a 5 French Pinnacle sheath was inserted. Through this, and also over a 0.035 inch guidewire a 5 [place] JB 1 catheter was advanced to the aortic arch region and selectively positioned in the innominate artery and the right common carotid artery, and left common carotid artery.  FINDINGS: The innominate artery injection reveals the origins of the right common carotid artery and the right subclavian artery to be widely patent.  The right vertebral artery origin is mildly narrowed. The vessel, otherwise, opacifies to the cranial skull base. Visualization of the right vertebrobasilar junction and partially the right posterior-inferior cerebellar artery and the basilar artery is seen on the lateral projection.  The right common carotid arteriogram demonstrates the right external carotid artery and its major branches to be widely patent.  The right internal carotid artery at the bulb to the cranial skull base demonstrates wide patency. The petrous, and the cavernous segments are widely patent.  The right posterior communicating artery is seen opacifying the right posterior cerebral distribution.  There is complete angiographic occlusion just distal to this.  The left common carotid arteriogram demonstrates the left external carotid artery and its major branches to be widely patent.   The left internal carotid artery at the bulb demonstrates approximately 60% stenosis at the bulb secondary to a smooth noncalcified plaque.  No evidence of ulcerations or of intraluminal filling defects is seen.  More distally, the left internal carotid artery is seen to opacify to the cranial skull base.  The petrous, cavernous and supraclinoid segments are widely patent.  Transient opacification of the left posterior communicating artery is seen.  The left middle cerebral artery and the left anterior cerebral artery opacify into the capillary and venous phases.  PROCEDURE: The diagnostic JB 1 catheter in the right common carotid artery was then exchanged over a 0.035 inch 300 cm Rosen exchange guidewire for an 8 [place] Pinnacle sheath in the right groin which was connected to continuous heparinized saline infusion. Over the [employer] guidewire, a Walrus 097, 95 cm balloon guide catheter which had been prepped with 50% contrast and 50% heparinized saline infusion was advanced and positioned into the distal right internal carotid artery. The guidewire was removed. Good aspiration obtained from the hub of the balloon guide catheter. A gentle contrast injection demonstrated no change in the intracranial circulation.  Over a 0.014 inch standard Synchro micro guidewire with a J configuration, an 021 Trevo ProVue microcatheter inside of a 6 French 132 cm Catalyst guide catheter was advanced to the cavernous right ICA.  The micro guidewire was then gently manipulated with a torque device and advanced into the distal M2 M3 inferior division. This was then followed by the microcatheter. The guidewire was removed. Good aspiration obtained from the hub of the microcatheter. Gentle contrast injection revealed a safe tip of the microcatheter which was then connected to continuous heparinized saline infusion.  At this time, the 6 [place] Catalyst guide catheter was advanced to the proximal portion of the occluded  supraclinoid right ICA. A Solitaire 4 mm x 40  mm X retrieval device was then advanced to the distal end of the microcatheter. The O ring on the delivery microcatheter was then loosened. With slight forward gentle traction with the right hand on the O ring, the delivery microcatheter was loosened. With slight forward traction with the right hand on the delivery micro guidewire, the delivery microcatheter was retrieved unsheathing the retrieval device.  With constant aspiration at the hub of the Catalyst guide catheter using a Penumbra aspiration device, and proximal flow arrest of the right internal carotid artery by inflating the balloon, and constant aspiration being applied with a 60 mL syringe at the hub of the balloon guide catheter for approximately 2 minutes, the combination of the retrieval device, the microcatheter and the 6 [place] Catalyst guide catheter were retrieved and removed. Flow arrest was reversed. Free aspiration of blood was seen at the hub of the [facility] balloon guide catheter. A control arteriogram performed through the balloon guide catheter in the right internal carotid artery now demonstrated complete angiographic revascularization of the right internal carotid artery terminus, the right MCA MCA distribution and the right anterior cerebral distribution. No evidence of intraluminal filling defects were seen. A TICI 3 revascularization was achieved.  Also no evidence of extravasation or mass-effect was seen. The right posterior communicating artery was widely patent.  The Walrus balloon guide catheter was retrieved and removed. The 8 French Pinnacle sheath in the right common femoral artery was removed and hemostasis was achieved with an 8 [place] Angio-Seal closure device. The right groin appeared soft. Distal pulses remained Dopplerable in the dorsalis pedis, and the posterior tibial arteries bilaterally unchanged.  The patient's flat panel CT of the brain revealed no evidence of  intracranial hemorrhage, mass effect or midline shift.  The patient's general anesthesia was then reversed. Upon recovery, the patient was able to respond to simple commands appropriately and was able to lift her left arm and left leg against gravity.  She was then transferred to the neuro ICU to continue with post thrombectomy management.  IMPRESSION: Status post endovascular complete revascularization of the right internal carotid artery terminus, right middle cerebral artery and right anterior cerebral distribution with 1 pass with the 4 mm x 40 mm Solitaire X retrieval device with Penumbra aspiration achieving a TICI 3 revascularization.  PLAN: Follow-up in the clinic 4 weeks post discharge.   Electronically Signed   By: Luanne Bras M.D.   On: 08/03/2019 09:26   IR ANGIO VERTEBRAL SEL SUBCLAVIAN INNOMINATE UNI R MOD SED  Result Date: 08/01/2019 INDICATION: New onset of right-sided gaze deviation, left sided weakness.  CT angiogram revealing occlusion of the right internal carotid artery terminus, the right middle cerebral artery and right anterior cerebral artery proximally.  EXAM: 1. EMERGENT LARGE VESSEL OCCLUSION THROMBOLYSIS (anterior CIRCULATION)  COMPARISON:  CT angiogram of the head and neck of July 31, 2019.  MEDICATIONS: Ancef 2 g IV antibiotic was administered within 1 hour of the procedure.  ANESTHESIA/SEDATION: General anesthesia.  CONTRAST:  Isovue 300 approximately 75 mL.  FLUOROSCOPY TIME:  Fluoroscopy Time: 26 minutes 0 seconds (1459 mGy).  COMPLICATIONS: None immediate.  TECHNIQUE: Following a full explanation of the procedure along with the potential associated complications, an informed witnessed consent was obtained. The risks of intracranial hemorrhage of 10%, worsening neurological deficit, ventilator dependency, death and inability to revascularize were all reviewed in detail with the patient's daughter.  The patient was then put under general anesthesia by  the Department of Anesthesiology at [facility].  The right groin was prepped and draped in the usual sterile fashion. Thereafter using modified Seldinger technique, transfemoral access into the right common femoral artery was obtained without difficulty. Over a 0.035 inch guidewire a 5 French Pinnacle sheath was inserted. Through this, and also over a 0.035 inch guidewire a 5 [place] JB 1 catheter was advanced to the aortic arch region and selectively positioned in the innominate artery and the right common carotid artery, and left common carotid artery.  FINDINGS: The innominate artery injection reveals the origins of the right common carotid artery and the right subclavian artery to be widely patent.  The right vertebral artery origin is mildly narrowed. The vessel, otherwise, opacifies to the cranial skull base. Visualization of the right vertebrobasilar junction and partially the right posterior-inferior cerebellar artery and the basilar artery is seen on the lateral projection.  The right common carotid arteriogram demonstrates the right external carotid artery and its major branches to be widely patent.  The right internal carotid artery at the bulb to the cranial skull base demonstrates wide patency. The petrous, and the cavernous segments are widely patent.  The right posterior communicating artery is seen opacifying the right posterior cerebral distribution.  There is complete angiographic occlusion just distal to this.  The left common carotid arteriogram demonstrates the left external carotid artery and its major branches to be widely patent.  The left internal carotid artery at the bulb demonstrates approximately 60% stenosis at the bulb secondary to a smooth noncalcified plaque.  No evidence of ulcerations or of intraluminal filling defects is seen.  More distally, the left internal carotid artery is seen to opacify to the cranial skull base.  The petrous, cavernous and supraclinoid  segments are widely patent.  Transient opacification of the left posterior communicating artery is seen.  The left middle cerebral artery and the left anterior cerebral artery opacify into the capillary and venous phases.  PROCEDURE: The diagnostic JB 1 catheter in the right common carotid artery was then exchanged over a 0.035 inch 300 cm Rosen exchange guidewire for an 8 [place] Pinnacle sheath in the right groin which was connected to continuous heparinized saline infusion. Over the [employer] guidewire, a Walrus 097, 95 cm balloon guide catheter which had been prepped with 50% contrast and 50% heparinized saline infusion was advanced and positioned into the distal right internal carotid artery. The guidewire was removed. Good aspiration obtained from the hub of the balloon guide catheter. A gentle contrast injection demonstrated no change in the intracranial circulation.  Over a 0.014 inch standard Synchro micro guidewire with a J configuration, an 021 Trevo ProVue microcatheter inside of a 6 French 132 cm Catalyst guide catheter was advanced to the cavernous right ICA.  The micro guidewire was then gently manipulated with a torque device and advanced into the distal M2 M3 inferior division. This was then followed by the microcatheter. The guidewire was removed. Good aspiration obtained from the hub of the microcatheter. Gentle contrast injection revealed a safe tip of the microcatheter which was then connected to continuous heparinized saline infusion.  At this time, the 6 [place] Catalyst guide catheter was advanced to the proximal portion of the occluded supraclinoid right ICA. A Solitaire 4 mm x 40 mm X retrieval device was then advanced to the distal end of the microcatheter. The O ring on the delivery microcatheter was then loosened. With slight forward gentle traction with the right hand on the O ring, the delivery microcatheter was loosened.  With slight forward traction with the right hand on the  delivery micro guidewire, the delivery microcatheter was retrieved unsheathing the retrieval device.  With constant aspiration at the hub of the Catalyst guide catheter using a Penumbra aspiration device, and proximal flow arrest of the right internal carotid artery by inflating the balloon, and constant aspiration being applied with a 60 mL syringe at the hub of the balloon guide catheter for approximately 2 minutes, the combination of the retrieval device, the microcatheter and the 6 [place] Catalyst guide catheter were retrieved and removed. Flow arrest was reversed. Free aspiration of blood was seen at the hub of the [facility] balloon guide catheter. A control arteriogram performed through the balloon guide catheter in the right internal carotid artery now demonstrated complete angiographic revascularization of the right internal carotid artery terminus, the right MCA MCA distribution and the right anterior cerebral distribution. No evidence of intraluminal filling defects were seen. A TICI 3 revascularization was achieved.  Also no evidence of extravasation or mass-effect was seen. The right posterior communicating artery was widely patent.  The Walrus balloon guide catheter was retrieved and removed. The 8 French Pinnacle sheath in the right common femoral artery was removed and hemostasis was achieved with an 8 [place] Angio-Seal closure device. The right groin appeared soft. Distal pulses remained Dopplerable in the dorsalis pedis, and the posterior tibial arteries bilaterally unchanged.  The patient's flat panel CT of the brain revealed no evidence of intracranial hemorrhage, mass effect or midline shift.  The patient's general anesthesia was then reversed. Upon recovery, the patient was able to respond to simple commands appropriately and was able to lift her left arm and left leg against gravity.  She was then transferred to the neuro ICU to continue with post thrombectomy management.  IMPRESSION:  Status post endovascular complete revascularization of the right internal carotid artery terminus, right middle cerebral artery and right anterior cerebral distribution with 1 pass with the 4 mm x 40 mm Solitaire X retrieval device with Penumbra aspiration achieving a TICI 3 revascularization.  PLAN: Follow-up in the clinic 4 weeks post discharge.   Electronically Signed   By: Julieanne Cotton M.D.   On: 08/03/2019 09:26      HISTORY OF PRESENT ILLNESS Veronica Holland is an 85 y.o. female with recent history of stroke for which she received tPA, who was brought in by EMS after daughter called 911 upon finding her mother sitting in a chair at home densely weak on the left side with an apparent unawareness of the deficit. Apparently had just eaten and had been doing a crossword puzzle - this along with her glasses was found on the floor. She was found with the deficits at 8:30 PM 07/31/2019, having been LKW at 1200 per neighbor who had stopped by then. She had a stroke in September from which she completely recovered, per daughter. Per interview with daughter, the patient's mRS is 0.   EMS noted left facial droop, left hemiplegia, rightward eye deviation and slurred speech on arrival to the patient's home. BP 140/74, HR 84 and irregular, CBG 325 en route.   She takes ASA and Plavix at home. Per report she is on insulin. Also takes losartan and atorvastatin.   She was not a tPA candidate as she was out of time window. CTA with occlusion R ICA, R M1, R A1 with 95mL penumbra R temporoparietal lobe. Felt to be a good IR candidate and transferred to suite for mechanical thrombectomy.  HOSPITAL COURSE Ms. Ruthanne Mcneish is a 85 y.o. female with history of previous stroke in Sep with full recovery and resuming all activities of daily life (mRS 0). Now presenting with acute onset of left hemiplegia, left hemineglect, left facial droop, left hemianopsia, dysarthria and anosognosia, NIHSS 19. She was not able to  get IV tPA d/t outside of time window, but did proceeded to [facility] for thrombectomy with TICI 3 revascularization.  Stroke: R MCA stroke with right ICA, MCA, ACA occlusion s/p IR w/ hemorrhagic transformation, infarct likely due to new diagnosis of Afib  CT Head -  Non acute.  ASPECTS 9. hyperdense R MCA  CTA H&N - acute occlusion of terminal RICA extending to R M1/A1. Bilat carotid athero dz w/o significant stenosis.   Cerebral angio R ICA, R MCA, R ACA occlusions with TICI 3 revascularization   MRI head hemorrhagic infarct R putamen and R caudate into R temporal lobe. Acute infarcts R frontal lobe and R occipital lobes. Small vessel disease.    Repeat CT head 12/24 evolving infarct R basal ganglia and adjacent white matter w/ contrast staining vs hemorrhage. Additional R temporal lobe infarcts. Mild mass effect.   Repeat CT head 12/31 - Decreasing petechial hemorrhage and swelling.  No new abnormality.  2D Echo - normal ejection fraction.  No cardiac source of embolism.    LDL - 50  HgbA1c - 6.9  UDS neg  ASA and Plavix prior to admission, now on ASA 325. Plan for eliquis 5mg  bid starting at 08/13/19 if remains neuro stable given hemorrhagic conversion seen on CT. Stop aspirin once eliquis started.   Therapy recommendations:  SNF  Disposition:  SNF  COVID testing for SNF d/c - neg   Atrial Fibrillation, new diagnosis  Dx confirmed PTA w/ patch  On pacerone 200 daily  CHA2DS2-VASc Score = at least 7, ?2 oral anticoagulation recommended             Age in Years:  ?26   +2                        Sex:  Female   Female   +1                      Hypertension History:  yes   +1                        Diabetes Mellitus:  yes   +1      Congestive Heart Failure History:  0             Vascular Disease History:  0                           Stroke/TIA/Thromboembolism History:  yes   +2  Plan eliquis 5mg  bid on 08/13/19 if remains stable given hemorrhagic conversion. Stop aspirin once  eliquis started.    Hypertension  Home BP meds: Cozaar  Current BP meds: Cozaar  BP Stable  BP goal normotensive  Hyperlipidemia  Home Lipid lowering medication: lipitor 20  LDL 50, goal < 70  Current lipid lowering medication: lipitor 20  High intensity statin not indicated as below goal and advanced age  Diabetes  Home diabetic meds: Levemir, Novolog  HgbA1c 6.9, goal < 7.0  SSI   On levemir  CBG monitoring - stable  Fever and leukocytosis, resolved  WBC 11.4 -> 10.2->13.8->9.1->10.1   Tmax - 101.1 -> afebrile   UA neg  blood culture NGTD   CXR Left base opacification likely effusion with atelectasis. Infection in the left base is possible.  Dysphagia  Secondary to stroke  Speech on board  On dys 1 diet with nectar thick     Urinary retention  Abdominal pain due to lower abdominal extension 12/28  Bladder scan  Foley placed 12/30  D/c with foley. Re-attempt removal in 1 week or per SNF protocol  Other Stroke Risk Factors  Advanced age  Hx stroke/TIA - stroke 04/2019 without residue as per chart - no details  Active Problems   Acute blood loss anemia IR IR 13.3->11.4->11.5->11.9->10.5->11.3   Right hip pain - severe right hip osteoarthritis on XR  Right knee and dorsal foot pain - tylenol PRN  Constipation - suppository and fleet enema  DISCHARGE EXAM Blood pressure (!) 133/51, pulse (!) 51, temperature 98 F (36.7 C), temperature source Oral, resp. rate 18, height  (1.626 m), weight 68 kg, SpO2 99 %. General - Well nourished, well developed, in no apparent distress.  Ophthalmologic - fundi not visualized due to noncooperation.  Cardiovascular - irregularly irregular heart rate and rhythm.  Neuro - sleepy but easily arousable, orientated to self, age, place, but not to time. Eyes open, no aphasia, mild dysarthria, following all simple commands. Able to name and repeat. Bilateral full gaze, no eye deviation, visual  field full, no hemianopia. Tracking bilaterally. PERRL. Left facial droop. Tongue midline. RUE 5/5 and RLE 3+/5 proximal due to groin and knee pain, 5/5 distally. LUE 3/5 proximal and distal. LLE 3/5 proximal and 4/5 distal. Left positive babinski. DTR 1+. Sensation symmetrical. Right FTN intact. Gait not tested.   Discharge Diet  Dysphagia 2 thin liquids  DISCHARGE PLAN  Disposition:  Skilled nursing facility for ongoing PT, OT and ST.  on ASA 325. Plan for eliquis  bid on 08/13/19 if neuro remains stable given hemorrhagic conversion seen on CT. Stop aspirin once eliquis started.   D/c with foley. Attempt removal in 1 week or per SNF protocol  Recommend ongoing stroke risk factor control by Primary Care Physician or provider at SNF at time of discharge.  Follow-up PCP or physician at SNF in 2 weeks.  Follow-up in [facility] in 4 weeks following discharge, office to schedule an appointment.   45 minutes were spent preparing discharge.  Marvel Plan, MD PhD Stroke Neurology 08/09/2019 4:20 PM

## 2019-08-06 NOTE — TOC Initial Note (Signed)
Transition of Care [facility]) - Initial/Assessment Note    Patient Details  Name: Veronica Holland MRN: [id number] Date of Birth: 1925-04-24  Transition of Care [facility]) CM/SW Contact:    Vinie Sill, [place] Phone Number: 08/06/2019, 1:23 PM  Clinical Narrative:                  CSW spoke with patient's daughter, Mardene Celeste. CSW introduced self and explained role. Patient lives home alone. Patient's daughter confirmed she is agreeable to discharge plan of ST rehab at [facility]. CSW informed [facility] is not accepting any new admissions. CSW was given permission to send referrals in the [facility] and [employer] area.   CSW will provide bed offers once available.   Thurmond Butts, MSW, [place] Social Worker   Expected Discharge Plan: [place]     Patient Goals and CMS Choice        Expected Discharge Plan and Services Expected Discharge Plan: [place] In-house Referral: Clinical Social Work                                            Prior Living Arrangements/Services   Lives with:: Self Patient language and need for interpreter reviewed:: No Do you feel safe going back to the place where you live?: No      Need for Family Participation in Patient Care: Yes (Comment) Care giver support system in place?: Yes (comment)   Criminal Activity/Legal Involvement Pertinent to Current Situation/Hospitalization: No - Comment as needed  Activities of Daily Living      Permission Sought/Granted Permission sought to share information with : Family Supports, Customer service manager, Case Manager Permission granted to share information with : Yes, Verbal Permission Granted  Share Information with NAME: Kathaleen Maser  Permission granted to share info w AGENCY: SNFs  Permission granted to share info w Relationship: daughter  Permission granted to share info w Contact Information: [phone] or [phone]  Emotional Assessment    Attitude/Demeanor/Rapport: Unable to Assess Affect (typically observed): Unable to Assess Orientation: : Oriented to Self Alcohol / Substance Use: Not Applicable Psych Involvement: No (comment)  Admission diagnosis:  Stroke ([place]) [I63.9] Stroke (cerebrum) ([place]) [I63.9] Acute left-sided weakness [R53.1] Middle cerebral artery embolism, right [I66.01] Patient Active Problem List   Diagnosis Date Noted  . 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 Multiple right-sided nontraumatic localized intracerebral hemorrhages ([place])   . Dysphagia, post-stroke   . Controlled type 2 diabetes mellitus with hyperglycemia, without long-term current use of insulin ([place])   . History of CVA (cerebrovascular accident)   . Acute blood loss anemia   . Acute left-sided weakness 07/31/2019  . Stroke (cerebrum) ([place]) 07/31/2019   PCP:  Patient, No Pcp Per Pharmacy:   [id number] [place], [address] [place] [address] Phone: [phone] Fax: [phone]     Social Determinants of Health (SDOH) Interventions    Readmission Risk Interventions No flowsheet data found.

## 2019-08-06 NOTE — Progress Notes (Signed)
Inpatient Rehabilitation-Admissions Coordinator   Met with pt bedside as follow up from PM&R consult. Pt appears to be a great candidate for CIR from a functional standpoint. Unfortunately, after discussing her home DC plan, pt's house is under renovation and will most likely not be ready in 3 weeks time for her to return. The pt's daughter has also stated she is unable to provide any physical assist at DC. At this time, [facility] would recommend SNF for post acute rehab to allow the patient a longer term rehab program.   Pt and her daughter are in agreement for SNF. AC will sign off and will notify [facility] team.   Raechel Ache, OTR/L  Rehab Admissions Coordinator  [phone] 08/06/2019 11:03 AM

## 2019-08-06 NOTE — Progress Notes (Signed)
STROKE TEAM PROGRESS NOTE   INTERVAL HISTORY Pt lying in bed, saying "I am doing worse", " I can not breath from my nose" "abdominal pain" "right groin pain down to the knee". On my exam, she has no SOB, but lower abdomen extension due to urinary retention. Right groin no hematoma, soft, nontender. she stated that she had I&O a couple of time this admission. She had bowel movement yesterday.    OBJECTIVE Vitals:   08/06/19 0100 08/06/19 0246 08/06/19 0400 08/06/19 0754  BP: 126/62  (!) 124/101 130/77  Pulse: 91 95 93 89  Resp: (!) 22 15 19  (!) 22  Temp:    97.7 F (36.5 C)  TempSrc:    Oral  SpO2: 93% 96% 93% 96%  Weight:      Height:        CBC:  Recent Labs  Lab 07/31/19 2133 08/01/19 0222 08/05/19 0218 08/06/19 0714  WBC 8.5 9.7 10.2 13.8*  NEUTROABS 7.8* 8.4*  --   --   HGB 12.6  13.3 11.4* 11.5* 11.9*  HCT 39.3  39.0 34.5* 34.7* 35.9*  MCV 94.2 92.2 91.1 91.6  PLT 229 223 196 254    Basic Metabolic Panel:  Recent Labs  Lab 08/05/19 0218 08/06/19 0714  NA 134* 136  K 3.6 3.8  CL 104 103  CO2 21* 21*  GLUCOSE 217* 116*  BUN 14 16  CREATININE 0.85 0.83  CALCIUM 8.3* 8.7*    Lipid Panel:     Component Value Date/Time   CHOL 99 08/01/2019 0355   TRIG 34 08/01/2019 0355   HDL 42 08/01/2019 0355   CHOLHDL 2.4 08/01/2019 0355   VLDL 7 08/01/2019 0355   LDLCALC 50 08/01/2019 0355   HgbA1c:  Lab Results  Component Value Date   HGBA1C 6.9 (H) 08/01/2019   Urine Drug Screen:     Component Value Date/Time   LABOPIA NONE DETECTED 07/31/2019 2227   COCAINSCRNUR NONE DETECTED 07/31/2019 2227   LABBENZ NONE DETECTED 07/31/2019 2227   AMPHETMU NONE DETECTED 07/31/2019 2227   THCU NONE DETECTED 07/31/2019 2227   LABBARB NONE DETECTED 07/31/2019 2227    Alcohol Level     Component Value Date/Time   ETH <10 08/01/2019 0537    IMAGING DG CHEST PORT 1 VIEW  Result Date: 08/04/2019 CLINICAL DATA:  Fever and leukocytosis. EXAM: PORTABLE CHEST 1 VIEW  COMPARISON:  06/13/2017 FINDINGS: Lungs are hypoinflated with subtle hazy interstitial prominence bilaterally. There is opacification over the left base with obscuration of the hemidiaphragm likely left effusion with associated basilar atelectasis. Cardiomediastinal silhouette and remainder the exam is unchanged. IMPRESSION: Left base opacification likely effusion with atelectasis. Infection in the left base is possible. Subtle hazy interstitial prominence which may be due to edema or infection. Electronically Signed   By: Marin Olp M.D.   On: 08/04/2019 10:13   DG HIP PORT UNILAT WITH PELVIS 1V RIGHT  Result Date: 08/04/2019 CLINICAL DATA:  85 year old with RIGHT hip pain of unspecified chronicity. EXAM: DG HIP (WITH OR WITHOUT PELVIS) 1V PORT RIGHT COMPARISON:  None. FINDINGS: No evidence of acute or subacute fracture or dislocation. Severe axial joint space narrowing with associated hypertrophic spurring involving the femoral head. Well-preserved bone mineral density for patient age. Included AP pelvis demonstrates moderate axial joint space narrowing involving the contralateral LEFT hip. Sacroiliac joints and symphysis pubis anatomically aligned without significant degenerative changes. Mild degenerative changes involving the visualized lower lumbar spine. IMPRESSION: 1. No acute or subacute osseous  abnormality. 2. Severe osteoarthritis of the right hip. 3. Moderate osteoarthritis involving the contralateral LEFT hip. Electronically Signed   By: Hulan Saas M.D.   On: 08/04/2019 17:30   Cerebral angio 08/01/2019 S/P bilateral common carotid arteriograms followed by complete revascularization of RT ICA terminus,RT MCA prox and RT ACA prox with x 1 pass with 77mm x 40 mm X soliaire retriever device and penumbra aspiration  Achieving a TICI 3 revascularization.  PHYSICAL EXAM  Temp:  [97.7 F (36.5 C)-98.6 F (37 C)] 97.7 F (36.5 C) (12/28 0754) Pulse Rate:  [88-116] 89 (12/28 0754) Resp:   [15-29] 22 (12/28 0754) BP: (124-171)/(62-101) 130/77 (12/28 0754) SpO2:  [93 %-98 %] 96 % (12/28 0754)  General - Well nourished, well developed, in no apparent distress.  Ophthalmologic - fundi not visualized due to noncooperation.  Cardiovascular - irregularly irregular heart rate and rhythm.  Neuro - awake alert, orientated to self, age, place, but not to time. Eyes open, no aphasia, mild dysarthria, following all simple commands. Able to name and repeat. Bilateral full gaze, no eye deviation, visual field full, no hemianopia. Tracking bilaterally. PERRL. Left facial droop. Tongue midline. RUE 5/5 and RLE 3/5 proximal due to groin and abdominal pain, 5/5 distally. LUE 3/5 proximal and distal. LLE 3/5 proximal and 4/5 distal. Left positive babinski. DTR 1+. Sensation symmetrical. Right FTN intact. Gait not tested.    ASSESSMENT/PLAN Ms. Leili Eskenazi is a 85 y.o. female with history of previous stroke in Sep with full recovery and resuming all activities of daily life (mRS 0). Now presenting with acute onset of left hemiplegia, left hemineglect, left facial droop, left hemianopsia, dysarthria and anosognosia, NIHSS 19. She was not able to get IV tPA d/t outside of time window, but did proceeded to [facility] for thrombectomy with TICI 3 revascularization.  Stroke: R MCA stroke with right tICA, MCA, ACA occlusion s/p IR w/ hemorrhagic transformation, likely due to new diagnosis of Afib  CT Head -  Non acute.  ASPECTS 9. hyperdense R MCA  CTA H&N - acute occlusion of terminal RICA extending to R M1/A1. Bilat carotid athero dz w/o significant stenosis.   Cerebral angio R ICA, R MCA, R ACA occlusions with TICI 3 revascularization   MRI head hemorrhagic infarct R putamen and R caudate into R temporal lobe. Acute infarcts R frontal lobe and R occipital lobes. Small vessel disease.    Repeat CT head 12/24 evolving infarct R basal ganglia and adjacent white matter w/ contrast staining vs hemorrhage.  Additional R temporal lobe infarcts. Mild mass effect.   2D Echo - normal ejection fraction.  No cardiac source of embolism.    LDL - 50  HgbA1c - 6.9  UDS neg  VTE prophylaxis - Lovenox 40 mg sq daily    ASA and Plavix prior to admission, now on ASA 325. Plan for DOAC in 10-14 days post stroke given hemorrhagic conversion seen on CT  Therapy recommendations:  CIR vs. SNF  Disposition:  Pending  Atrial Fibrillation, new diagnosis  Dx confirmed PTA w/ patch  On pacerone 200 daily  CHA2DS2-VASc Score = at least 7, ?2 oral anticoagulation recommended  Age in Years:  ?40   +2    Sex:  Female   Female   +1    Hypertension History:  yes   +1     Diabetes Mellitus:  yes   +1  Congestive Heart Failure History:  0  Vascular Disease History:  0  Stroke/TIA/Thromboembolism History:  yes   +2 . Plan AC in 10-14 days post stroke if remains stable given hemorrhagic conversion   Hypertension  Home BP meds: Cozaar  Current BP meds: Cozaar  BP Stable . Permissive hypertension (OK if < 180/110) but gradually normalize in 5-7 days . Long-term BP goal normotensive  Hyperlipidemia  Home Lipid lowering medication: lipitor 20  LDL 50, goal < 70  Current lipid lowering medication: lipitor 20  High intensity statin not indicated as below goal and advanced age  Diabetes  Home diabetic meds: Levemir, Novolog  HgbA1c 6.9, goal < 7.0  SSI   On levemir  CBG monitoring - stable  Fever and leukocytosis  WBC 11.4 -> 10.2->13.8   Tmax - 101.1 -> afebrile   UA neg  blood culture NGTD   CXR Left base opacification likely effusion with atelectasis. Infection in the left base is possible.  Dysphagia . Secondary to stroke . Speech on board . On dys 1 diet with nectar thick   . Pt intake not adequate . Continue NS @ 40   Urinary retention  Abdominal pain due to lower abdominal extension  Consider foley if I&O more than 3 times  Bladder scan stat  I&O PRN  Other  Stroke Risk Factors  Advanced age  Hx stroke/TIA - stroke 04/2019 without residue as per chart - no details  Active Problems   Acute blood loss anemia IR IR 13.3->11.4->11.5->11.9   Right hip pain - severe right hip osteoarthritis on XR  Right knee pain - tylenol PRN  Constipation - suppository and fleet enema  Hospital day # 6  Marvel PlanJindong Emani Taussig, MD PhD Stroke Neurology 08/06/2019 10:07 AM   To contact Stroke Continuity provider, please refer to [email or web address]. After hours, contact General Neurology

## 2019-08-07 ENCOUNTER — Encounter (HOSPITAL_COMMUNITY): Payer: Self-pay

## 2019-08-07 DIAGNOSIS — E7800 Pure hypercholesterolemia, unspecified: Secondary | ICD-10-CM

## 2019-08-07 DIAGNOSIS — I4821 Permanent atrial fibrillation: Secondary | ICD-10-CM

## 2019-08-07 DIAGNOSIS — I10 Essential (primary) hypertension: Secondary | ICD-10-CM

## 2019-08-07 DIAGNOSIS — R339 Retention of urine, unspecified: Secondary | ICD-10-CM

## 2019-08-07 LAB — CBC
HCT: 31.8 % — ABNORMAL LOW (ref 36.0–46.0)
Hemoglobin: 10.5 g/dL — ABNORMAL LOW (ref 12.0–15.0)
MCH: 29.8 pg (ref 26.0–34.0)
MCHC: 33 g/dL (ref 30.0–36.0)
MCV: 90.3 fL (ref 80.0–100.0)
Platelets: 226 10*3/uL (ref 150–400)
RBC: 3.52 MIL/uL — ABNORMAL LOW (ref 3.87–5.11)
RDW: 12.7 % (ref 11.5–15.5)
WBC: 9.1 10*3/uL (ref 4.0–10.5)
nRBC: 0 % (ref 0.0–0.2)

## 2019-08-07 LAB — BASIC METABOLIC PANEL
Anion gap: 6 (ref 5–15)
BUN: 19 mg/dL (ref 8–23)
CO2: 24 mmol/L (ref 22–32)
Calcium: 8.1 mg/dL — ABNORMAL LOW (ref 8.9–10.3)
Chloride: 107 mmol/L (ref 98–111)
Creatinine, Ser: 0.82 mg/dL (ref 0.44–1.00)
GFR calc Af Amer: >60 mL/min (ref >60)
GFR calc non Af Amer: >60 mL/min (ref >60)
Glucose, Bld: 79 mg/dL (ref 70–99)
Potassium: 3.5 mmol/L (ref 3.5–5.1)
Sodium: 137 mmol/L (ref 135–145)

## 2019-08-07 LAB — GLUCOSE, CAPILLARY
Glucose-Capillary: 72 mg/dL (ref 70–99)
Glucose-Capillary: 140 mg/dL — ABNORMAL HIGH (ref 70–99)
Glucose-Capillary: 224 mg/dL — ABNORMAL HIGH (ref 70–99)
Glucose-Capillary: 167 mg/dL — ABNORMAL HIGH (ref 70–99)

## 2019-08-07 LAB — SARS CORONAVIRUS 2 (TAT 6-24 HRS): SARS Coronavirus 2: NEGATIVE

## 2019-08-07 NOTE — TOC Progression Note (Signed)
Transition of Care [facility]) - Progression Note    Patient Details  Name: [name] MRN: [id number] Date of Birth: 1924-11-18  Transition of Care [facility]) CM/SW [place], [place] Phone Number: 08/07/2019, 11:08 AM  Clinical Narrative:     CSW called patient's daughter, Mardene Celeste - left voice message with bed offers and to return call.  Thurmond Butts, MSW, [place] Clinical Social Worker   Expected Discharge Plan: [place]    Expected Discharge Plan and Services Expected Discharge Plan: [place] In-house Referral: Clinical Social Work                                             Social Determinants of Health (SDOH) Interventions    Readmission Risk Interventions No flowsheet data found.

## 2019-08-07 NOTE — Progress Notes (Signed)
Physical Therapy Treatment Patient Details Name: Veronica Holland MRN: [id number] DOB: Sep 14, 1924 Today's Date: 08/07/2019    History of Present Illness 85 yo female s/p IR revasularization of R MCA with CT (+) R MCA occlusio. Head CT 12/24-Evolving acute infarction of the right basal ganglia and adjacent white matter with hyperdensity reflecting contrast staining and/orhemorrhage. Additional evolving acute infarction of the anterior right temporal lobe. PMH :  stroke, DM    PT Comments    Notable improvements in attention and ease of mobility, though not significant.  Emphasis on transitions, sit to stand, pregait and balance.    Follow Up Recommendations  Supervision/Assistance - 24 hour;SNF;Other (comment)(pt doesn't have set destination or 24* assist at this point.)     Equipment Recommendations  Other (comment)(TBA)    Recommendations for Other Services Rehab consult     Precautions / Restrictions Precautions Precautions: Fall    Mobility  Bed Mobility Overal bed mobility: Needs Assistance Bed Mobility: Rolling;Supine to Sit Rolling: Mod assist   Supine to sit: Mod assist     General bed mobility comments: bridged to EOB with mod, scooting to EOB with min guard. up via L UE  Transfers Overall transfer level: Needs assistance Equipment used: Rolling walker (2 wheeled) Transfers: Sit to/from Stand Sit to Stand: Mod assist;+2 physical assistance;Max assist(varying with standing trials)         General transfer comment: use bil UE's to push up.  Assisted to come both forward and boost. Notably more patient assist today, but moderate assist at best.  Ambulation/Gait             General Gait Details: Unable.   Stairs             Wheelchair Mobility    Modified Rankin (Stroke Patients Only) Modified Rankin (Stroke Patients Only) Pre-Morbid Rankin Score: No symptoms Modified Rankin: Severe disability     Balance Overall balance assessment: Needs  assistance Sitting-balance support: Single extremity supported;Feet supported Sitting balance-Leahy Scale: Fair Sitting balance - Comments: mild L Lateral list. pt able to take visual feedback and make better corrections without required use of uE's  Still some inattension, but further improvement   Standing balance support: Bilateral upper extremity supported Standing balance-Leahy Scale: Zero Standing balance comment: stood into the RW.  Worked on pre gait including w/shift, upright stance and L knee stability.  Stood x3 and pt able to give more assist than last treatment                            Cognition Arousal/Alertness: Awake/alert Behavior During Therapy: WFL for tasks assessed/performed Overall Cognitive Status: No family/caregiver present to determine baseline cognitive functioning(NT formally but generally functional)                                 General Comments: follows commands well.      Exercises Other Exercises Other Exercises: Warm up ROM exercise prior to mobility    General Comments        Pertinent Vitals/Pain Pain Assessment: No/denies pain    Home Living                      Prior Function            PT Goals (current goals can now be found in the care plan section) Acute Rehab PT Goals Patient Stated  Goal: to get rehab PT Goal Formulation: With patient Time For Goal Achievement: 08/15/19 Potential to Achieve Goals: Good    Frequency    Min 3X/week      PT Plan Discharge plan needs to be updated;Frequency needs to be updated    Co-evaluation              AM-PAC PT "6 Clicks" Mobility   Outcome Measure  Help needed turning from your back to your side while in a flat bed without using bedrails?: A Lot Help needed moving from lying on your back to sitting on the side of a flat bed without using bedrails?: Total Help needed moving to and from a bed to a chair (including a wheelchair)?:  Total Help needed standing up from a chair using your arms (e.g., wheelchair or bedside chair)?: Total Help needed to walk in hospital room?: Total Help needed climbing 3-5 steps with a railing? : Total 6 Click Score: 7    End of Session   Activity Tolerance: Patient tolerated treatment well;Patient limited by fatigue Patient left: in chair;with call bell/phone within reach;with chair alarm set;with family/visitor present Nurse Communication: Mobility status PT Visit Diagnosis: Hemiplegia and hemiparesis;Other abnormalities of gait and mobility (R26.89) Hemiplegia - Right/Left: Left Hemiplegia - dominant/non-dominant: Non-dominant Hemiplegia - caused by: Cerebral infarction     Time: 4174-0814 PT Time Calculation (min) (ACUTE ONLY): 33 min  Charges:  $Therapeutic Activity: 23-37 mins                     08/07/2019  Veronica Halim., PT Acute Rehabilitation Services [phone]  (pager) [phone]  (office)   Veronica Holland Veronica Holland 08/07/2019, 6:38 PM

## 2019-08-07 NOTE — Progress Notes (Signed)
0400: At beginning of shift, pt had episode of incontinence and bed pad was saturated. Purewick placed. At 0400, Pt unable to void since earlier and bladder scan completed. Bladder scan showed >999 mL. Pt states she does not feel fullness. In & Out performed and had output of 1425. Will continue to monitor.

## 2019-08-07 NOTE — Progress Notes (Signed)
STROKE TEAM PROGRESS NOTE   INTERVAL HISTORY RN at bedside. Pt drowsy sleepy today, but easily arousable and fully orientated.  Right hip knee ankle pain much better than yesterday.  Urinary retention resolved, no need for Foley at this time.  Worked with PT/OT and recommend SNF.   OBJECTIVE Vitals:   08/06/19 1930 08/06/19 2335 08/07/19 0400 08/07/19 0749  BP:  (!) 122/50  133/63  Pulse: 83 (!) 57  73  Resp: 19   20  Temp:  98.4 F (36.9 C) 98.8 F (37.1 C) (!) 97.5 F (36.4 C)  TempSrc:  Oral Oral   SpO2: 98%     Weight:      Height:        CBC:  Recent Labs  Lab 07/31/19 2133 08/01/19 0222 08/06/19 0714 08/07/19 0530  WBC 8.5 9.7 13.8* 9.1  NEUTROABS 7.8* 8.4*  --   --   HGB 12.6  13.3 11.4* 11.9* 10.5*  HCT 39.3  39.0 34.5* 35.9* 31.8*  MCV 94.2 92.2 91.6 90.3  PLT 229 223 223 226    Basic Metabolic Panel:  Recent Labs  Lab 08/06/19 0714 08/07/19 0530  NA 136 137  K 3.8 3.5  CL 103 107  CO2 21* 24  GLUCOSE 116* 79  BUN 16 19  CREATININE 0.83 0.82  CALCIUM 8.7* 8.1*    Lipid Panel:     Component Value Date/Time   CHOL 99 08/01/2019 0355   TRIG 34 08/01/2019 0355   HDL 42 08/01/2019 0355   CHOLHDL 2.4 08/01/2019 0355   VLDL 7 08/01/2019 0355   LDLCALC 50 08/01/2019 0355   HgbA1c:  Lab Results  Component Value Date   HGBA1C 6.9 (H) 08/01/2019   Urine Drug Screen:     Component Value Date/Time   LABOPIA NONE DETECTED 07/31/2019 2227   COCAINSCRNUR NONE DETECTED 07/31/2019 2227   LABBENZ NONE DETECTED 07/31/2019 2227   AMPHETMU NONE DETECTED 07/31/2019 2227   THCU NONE DETECTED 07/31/2019 2227   LABBARB NONE DETECTED 07/31/2019 2227    Alcohol Level     Component Value Date/Time   ETH <10 08/01/2019 0537    IMAGING past 24h No results found.  Cerebral angio 08/01/2019 S/P bilateral common carotid arteriograms followed by complete revascularization of RT ICA terminus,RT MCA prox and RT ACA prox with x 1 pass with 60mm x 40 mm X  soliaire retriever device and penumbra aspiration  Achieving a TICI 3 revascularization.  PHYSICAL EXAM   Temp:  [97.5 F (36.4 C)-98.8 F (37.1 C)] 97.5 F (36.4 C) (12/29 0749) Pulse Rate:  [57-88] 73 (12/29 0749) Resp:  [17-20] 20 (12/29 0749) BP: (104-133)/(50-67) 133/63 (12/29 0749) SpO2:  [95 %-98 %] 98 % (12/28 1930)  General - Well nourished, well developed, in no apparent distress.  Ophthalmologic - fundi not visualized due to noncooperation.  Cardiovascular - irregularly irregular heart rate and rhythm.  Neuro - sleepy but easily arousable, orientated to self, age, place, but not to time. Eyes open, no aphasia, mild dysarthria, following all simple commands. Able to name and repeat. Bilateral full gaze, no eye deviation, visual field full, no hemianopia. Tracking bilaterally. PERRL. Left facial droop. Tongue midline. RUE 5/5 and RLE 3+/5 proximal due to groin and knee pain, 5/5 distally. LUE 3/5 proximal and distal. LLE 3/5 proximal and 4/5 distal. Left positive babinski. DTR 1+. Sensation symmetrical. Right FTN intact. Gait not tested.    ASSESSMENT/PLAN [name] is a 85 y.o. female with history  of previous stroke in Sep with full recovery and resuming all activities of daily life (mRS 0). Now presenting with acute onset of left hemiplegia, left hemineglect, left facial droop, left hemianopsia, dysarthria and anosognosia, NIHSS 19. She was not able to get IV tPA d/t outside of time window, but [facility] with TICI 3 revascularization.  Stroke: R MCA stroke with right tICA, MCA, ACA occlusion s/p IR w/ hemorrhagic transformation, likely due to new diagnosis of Afib  CT Head -  Non acute.  ASPECTS 9. hyperdense R MCA  CTA H&N - acute occlusion of terminal RICA extending to R M1/A1. Bilat carotid athero dz w/o significant stenosis.   Cerebral angio R ICA, R MCA, R ACA occlusions with TICI 3 revascularization   MRI head hemorrhagic infarct R  putamen and R caudate into R temporal lobe. Acute infarcts R frontal lobe and R occipital lobes. Small vessel disease.    Repeat CT head 12/24 evolving infarct R basal ganglia and adjacent white matter w/ contrast staining vs hemorrhage. Additional R temporal lobe infarcts. Mild mass effect.   2D Echo - normal ejection fraction.  No cardiac source of embolism.    LDL - 50  HgbA1c - 6.9  UDS neg  VTE prophylaxis - Lovenox 40 mg sq daily    ASA and Plavix prior to admission, now on ASA 325. Plan for DOAC in 10-14 days post stroke given hemorrhagic conversion seen on CT  Therapy recommendations:  SNF  Disposition:  Pending  COVID testing for SNF - neg   Atrial Fibrillation, new diagnosis  Dx confirmed PTA w/ patch  On pacerone 200 daily  CHA2DS2-VASc Score = at least 7, ?2 oral anticoagulation recommended  Age in Years:  ?[age]   +2    Sex:  Female   Female   +1    Hypertension History:  yes   +1     Diabetes Mellitus:  yes   +1  Congestive Heart Failure History:  0  Vascular Disease History:  0     Stroke/TIA/Thromboembolism History:  yes   +2 . Plan AC in 10-14 days post stroke if remains stable given hemorrhagic conversion   Hypertension  Home BP meds: Cozaar  Current BP meds: Cozaar  BP Stable . BP goal normotensive  Hyperlipidemia  Home Lipid lowering medication: lipitor 20  LDL 50, goal < 70  Current lipid lowering medication: lipitor 20  High intensity statin not indicated as below goal and advanced age  Diabetes  Home diabetic meds: Levemir, Novolog  HgbA1c 6.9, goal < 7.0  SSI   On levemir  CBG monitoring - stable  Fever and leukocytosis  WBC 11.4 -> 10.2->13.8->9.1   Tmax - 101.1 -> afebrile   UA neg  blood culture NGTD   CXR Left base opacification likely effusion with atelectasis. Infection in the left base is possible.  Dysphagia . Secondary to stroke . Speech on board . On dys 1 diet with nectar thick   . D/c IVF   Urinary  retention  Abdominal pain due to lower abdominal extension  Bladder scan  I&O PRN  Consider foley if I&O more than 3 times - does not need foley at this time   Other Stroke Risk Factors  Advanced age  Hx stroke/TIA - stroke 04/2019 without residue as per chart - no details  Active Problems   Acute blood loss anemia IR IR 13.3->11.4->11.5->11.9->10-5   Right hip pain - severe right hip osteoarthritis  on XR  Right knee pain - tylenol PRN  Constipation - suppository and fleet enema  Hospital day # 7  Rosalin Hawking, MD PhD Stroke Neurology 08/07/2019 11:11 AM   To contact Stroke Continuity provider, please refer to [email or web address] After hours, contact General Neurology

## 2019-08-07 NOTE — Progress Notes (Signed)
  Speech Language Pathology Treatment: Dysphagia  Patient Details Name: Latana Colin MRN: [id number] DOB: 09-19-1924 Today's Date: 08/07/2019 Time: 1600-1610 SLP Time Calculation (min) (ACUTE ONLY): 10 min  Assessment / Plan / Recommendation Clinical Impression  Pt was seen for skilled ST targeting dysphagia goals.  Upon arrival, pt was sleeping but awakened easily to voice and light touch and was agreeable to participating in treatment.  SLP facilitated the session with trials of advanced textures to continue working towards diet progression.  Per chart review, pt seems to have made significant progress in the automaticity of her swallow.  Oral phase was timely with purees and thin liquids.  No overt s/s of aspiration with evident across any consistencies assessed, even when pt took consecutive sips of thins via straw or mixed solid and liquid consistencies.  Pt did have markedly prolonged mastication of dys 3 solids and required min cues for use of liquid wash and finger sweep to clear residue from the oral cavity.  As a result, I would recommend advancing pt to dys 2 textures and thin liquids with full supervision for use of swallowing precautions (check for left pocketing, alternate solids and liquids).  Pt was left in bed with bed alarm set and call bell within reach.  Continue per current plan of care.    HPI HPI: 85yo female admitted 07/31/2019 with left weakness, right gaze preference, dysarthria.  PMH: CVA (04/2019) DM2, HTN, PAFib, CKD3, HLD, diverticulosis, OA, peripheral neuropathy. HeadCT = RMCA, early infarct R lateral basal ganglia. Intubated for IR. MRI pending      SLP Plan  Continue with current plan of care       Recommendations  Diet recommendations: Dysphagia 2 (fine chop);Thin liquid Liquids provided via: Cup;Straw Medication Administration: Crushed with puree Supervision: Full supervision/cueing for compensatory strategies Compensations: Minimize environmental  distractions;Slow rate;Small sips/bites Postural Changes and/or Swallow Maneuvers: Seated upright 90 degrees;Upright 30-60 min after meal                Oral Care Recommendations: Oral care QID Follow up Recommendations: Inpatient Rehab;Skilled Nursing facility SLP Visit Diagnosis: Dysphagia, unspecified (R13.10) Plan: Continue with current plan of care       GO                Yanilen Adamik, Selinda Orion 08/07/2019, 4:21 PM

## 2019-08-07 NOTE — TOC Progression Note (Signed)
Transition of Care [facility]) - Progression Note    Patient Details  Name: Veronica Holland MRN: [id number] Date of Birth: 01-Dec-1924  Transition of Care [facility]) CM/SW [place], [place] Phone Number: 08/07/2019, 12:27 PM  Clinical Narrative:     CSW spoke with the patient's daughter,Patricia- she has decided on [facility] for rehab.  CSW contacted [facility] - waiting on [facility] to confirm bed offer.   Patient's covid is pending.   Thurmond Butts, MSW, [place] Clinical Social Worker   Expected Discharge Plan: [place]    Expected Discharge Plan and Services Expected Discharge Plan: [place] In-house Referral: Clinical Social Work                                             Social Determinants of Health (SDOH) Interventions    Readmission Risk Interventions No flowsheet data found.

## 2019-08-08 LAB — CBC
HCT: 35.1 % — ABNORMAL LOW (ref 36.0–46.0)
Hemoglobin: 11.3 g/dL — ABNORMAL LOW (ref 12.0–15.0)
MCH: 30.1 pg (ref 26.0–34.0)
MCHC: 32.2 g/dL (ref 30.0–36.0)
MCV: 93.6 fL (ref 80.0–100.0)
Platelets: 291 10*3/uL (ref 150–400)
RBC: 3.75 MIL/uL — ABNORMAL LOW (ref 3.87–5.11)
RDW: 12.6 % (ref 11.5–15.5)
WBC: 10.1 10*3/uL (ref 4.0–10.5)
nRBC: 0 % (ref 0.0–0.2)

## 2019-08-08 LAB — GLUCOSE, CAPILLARY
Glucose-Capillary: 90 mg/dL (ref 70–99)
Glucose-Capillary: 121 mg/dL — ABNORMAL HIGH (ref 70–99)
Glucose-Capillary: 117 mg/dL — ABNORMAL HIGH (ref 70–99)
Glucose-Capillary: 156 mg/dL — ABNORMAL HIGH (ref 70–99)
Glucose-Capillary: 163 mg/dL — ABNORMAL HIGH (ref 70–99)

## 2019-08-08 LAB — BASIC METABOLIC PANEL
Anion gap: 9 (ref 5–15)
BUN: 17 mg/dL (ref 8–23)
CO2: 24 mmol/L (ref 22–32)
Calcium: 8.7 mg/dL — ABNORMAL LOW (ref 8.9–10.3)
Chloride: 107 mmol/L (ref 98–111)
Creatinine, Ser: 0.86 mg/dL (ref 0.44–1.00)
GFR calc Af Amer: >60 mL/min (ref >60)
GFR calc non Af Amer: 59 mL/min — ABNORMAL LOW (ref >60)
Glucose, Bld: 97 mg/dL (ref 70–99)
Potassium: 4.2 mmol/L (ref 3.5–5.1)
Sodium: 140 mmol/L (ref 135–145)

## 2019-08-08 MED ORDER — ASPIRIN 325 MG PO TBEC: 325.0000 mg | DELAYED_RELEASE_TABLET | Freq: Every day | ORAL | 0 refills | Status: AC

## 2019-08-08 NOTE — TOC Progression Note (Signed)
Transition of Care [facility]) - Progression Note    Patient Details  Name: Veronica Holland MRN: [id number] Date of Birth: 1924-09-10  Transition of Care [facility]) CM/SW [place], [place] Phone Number: [phone] 08/08/2019, 1:46 PM  Clinical Narrative:     CSW spoke with Juliann Pulse at [facility], acknowledges she had confirmed bed offer on 12/29 however reports they had patients not discharge as planned, therefore they do not have a bed today available, however will have a bed tomorrow. MD made aware of tomorrows discharge to [facility].   Expected Discharge Plan: [place]    Expected Discharge Plan and Services Expected Discharge Plan: [place] In-house Referral: Clinical Social Work       Expected Discharge Date: 08/08/19                                     Social Determinants of Health (SDOH) Interventions    Readmission Risk Interventions No flowsheet data found.

## 2019-08-08 NOTE — [place] FL2 (Signed)
[place] MEDICAID FL2 LEVEL OF CARE SCREENING TOOL     IDENTIFICATION  Patient Name: Veronica Holland Birthdate: 1925-01-05 Sex: female Admission Date (Current Location): 07/31/2019  [facility] and [place] Number:  Producer, television/film/video and Address:  [facility] [place]. [facility], [address], [place], [place] [address]      Provider Number: [id number]  Attending Physician Name and Address:  Marvel Plan, MD  Relative Name and Phone Number:  Lupita Raider [phone] or [phone](home)    Current Level of Care: Hospital Recommended Level of Care: Skilled Nursing Facility Prior Approval Number:    Date Approved/Denied:   PASRR Number: [id number]  Discharge Plan: SNF    Current Diagnoses: Patient Active Problem List   Diagnosis Date Noted  . Atrial fibrillation (HCC) 08/06/2019  . Essential hypertension 08/06/2019  . Hyperlipidemia 08/06/2019  . Diabetes mellitus type II, controlled (HCC) 08/06/2019  . Urinary retention 08/06/2019  . Right hip and leg pain 08/06/2019  . Constipation 08/06/2019  . 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 Dysphagia, post-stroke   . Controlled type 2 diabetes mellitus with hyperglycemia, without long-term current use of insulin (HCC)   . History of CVA (cerebrovascular accident)   . Acute blood loss anemia   . Acute left-sided weakness 07/31/2019  . Stroke (cerebrum) (HCC) 07/31/2019    Orientation RESPIRATION BLADDER Height & Weight     Self, Time, Situation, Place  Normal Incontinent, External catheter Weight: 149 lb 14.6 oz (68 kg) Height:  5\' 4"  (162.6 cm)  BEHAVIORAL SYMPTOMS/MOOD NEUROLOGICAL BOWEL NUTRITION STATUS      Incontinent Diet(please see discharge summary)  AMBULATORY STATUS COMMUNICATION OF NEEDS Skin     Verbally Surgical wounds(incision(closed) groin right, foam lift dressing, clean,dry intact)                        Personal Care Assistance Level of Assistance  Bathing, Dressing, Feeding Bathing Assistance: Limited assistance Feeding assistance: Independent Dressing Assistance: Limited assistance     Functional Limitations Info  Sight, Hearing, Speech Sight Info: Adequate Hearing Info: Adequate Speech Info: Adequate    SPECIAL CARE FACTORS FREQUENCY  PT (By licensed PT), OT (By licensed OT)     PT Frequency: 3x per week OT Frequency: 3x per ewek            Contractures Contractures Info: Not present    Additional Factors Info  Code Status, Allergies, Insulin Sliding Scale Code Status Info: FULL Allergies Info: Bee Venom,Penicillin G   Insulin Sliding Scale Info: insulin aspart (novoLOG) injection 0-9 Units  3x daily w/meals,insulin detemir (LEVEMIR) injection 25 Units  daily at bedtime       Current Medications (08/08/2019):  This is the current hospital active medication list Current Facility-Administered Medications  Medication Dose Route Frequency Provider Last Rate Last Admin  .  stroke: mapping our early stages of recovery book   Does not apply Once 08/10/2019, MD      . acetaminophen (TYLENOL) tablet 650 mg  650 mg Oral Q4H PRN Micki Riley, MD   650 mg at 08/08/19 08/10/19   Or  . acetaminophen (TYLENOL) 160 MG/5ML solution 650 mg  650 mg Per Tube Q4H PRN 9150, MD       Or  . acetaminophen (TYLENOL) suppository 650 mg  650 mg Rectal Q4H PRN Micki Riley, MD      .  amiodarone (PACERONE) tablet 200 mg  200 mg Oral Daily Garvin Fila, MD   200 mg at 08/08/19 1100  . aspirin EC tablet 325 mg  325 mg Oral Daily Rosalin Hawking, MD   325 mg at 08/08/19 1100  . atorvastatin (LIPITOR) tablet 20 mg  20 mg Oral q1800 Garvin Fila, MD   20 mg at 08/07/19 1755  . calcium-vitamin D (OSCAL WITH D) 500-200 MG-UNIT per tablet 1 tablet  1 tablet Oral Q breakfast Garvin Fila, MD   1 tablet at 08/08/19 1100  . Chlorhexidine Gluconate Cloth 2 % PADS 6 each  6  each Topical Daily Garvin Fila, MD   6 each at 08/07/19 [id number]  . enoxaparin (LOVENOX) injection 40 mg  40 mg Subcutaneous Q24H Burnetta Sabin L, NP   40 mg at 08/08/19 1119  . insulin aspart (novoLOG) injection 0-9 Units  0-9 Units Subcutaneous TID WC Rosalin Hawking, MD   1 Units at 08/08/19 1123  . insulin detemir (LEVEMIR) injection 25 Units  25 Units Subcutaneous QHS Garvin Fila, MD   25 Units at 08/07/19 2145  . latanoprost (XALATAN) 0.005 % ophthalmic solution 1 drop  1 drop Both Eyes QHS Garvin Fila, MD   1 drop at 08/07/19 2147  . losartan (COZAAR) tablet 25 mg  25 mg Oral Daily Garvin Fila, MD   25 mg at 08/08/19 1100  . ondansetron (ZOFRAN) injection 4 mg  4 mg Intravenous Q6H PRN Garvin Fila, MD      . Resource ThickenUp Clear   Oral PRN Rosalin Hawking, MD      . senna-docusate (Senokot-S) tablet 1 tablet  1 tablet Oral QHS PRN Garvin Fila, MD   1 tablet at 08/03/19 [phone]  . sodium chloride flush (NS) 0.9 % injection 10-40 mL  10-40 mL Intracatheter Q12H Rosalin Hawking, MD   10 mL at 08/08/19 1100  . sodium chloride flush (NS) 0.9 % injection 10-40 mL  10-40 mL Intracatheter PRN Rosalin Hawking, MD      . sodium phosphate (FLEET) 7-19 GM/118ML enema 1 enema  1 enema Rectal Once Rosalin Hawking, MD         Discharge Medications: Please see discharge summary for a list of discharge medications.  Relevant Imaging Results:  Relevant Lab Results:   Additional Information SSN: [id number]  Alberteen Sam, LCSW

## 2019-08-08 NOTE — Progress Notes (Signed)
STROKE TEAM PROGRESS NOTE   INTERVAL HISTORY Pt lying in bed, had urinary retention and foley was placed overnight. No other acute event overnight. Pt still complains of right hip and knee as well as dorsal foot pain, but on exam, no redness, no swelling, no tenderness on palpation.    OBJECTIVE Vitals:   08/08/19 0400 08/08/19 0445 08/08/19 0743 08/08/19 0800  BP: (!) 142/60  (!) 122/49 (!) 82/57  Pulse: 72 73 68 67  Resp: 20 (!) 24 (!) 22 (!) 23  Temp:   97.8 F (36.6 C)   TempSrc:   Oral   SpO2: 98% 99% 96% 96%  Weight:      Height:        CBC:  Recent Labs  Lab 08/07/19 0530 08/08/19 0637  WBC 9.1 10.1  HGB 10.5* 11.3*  HCT 31.8* 35.1*  MCV 90.3 93.6  PLT 226 291    Basic Metabolic Panel:  Recent Labs  Lab 08/07/19 0530 08/08/19 0637  NA 137 140  K 3.5 4.2  CL 107 107  CO2 24 24  GLUCOSE 79 97  BUN 19 17  CREATININE 0.82 0.86  CALCIUM 8.1* 8.7*    Lipid Panel:     Component Value Date/Time   CHOL 99 08/01/2019 0355   TRIG 34 08/01/2019 0355   HDL 42 08/01/2019 0355   CHOLHDL 2.4 08/01/2019 0355   VLDL 7 08/01/2019 0355   LDLCALC 50 08/01/2019 0355   HgbA1c:  Lab Results  Component Value Date   HGBA1C 6.9 (H) 08/01/2019   Urine Drug Screen:     Component Value Date/Time   LABOPIA NONE DETECTED 07/31/2019 2227   COCAINSCRNUR NONE DETECTED 07/31/2019 2227   LABBENZ NONE DETECTED 07/31/2019 2227   AMPHETMU NONE DETECTED 07/31/2019 2227   THCU NONE DETECTED 07/31/2019 2227   LABBARB NONE DETECTED 07/31/2019 2227    Alcohol Level     Component Value Date/Time   ETH <10 08/01/2019 0537    IMAGING past 24h No results found.  Cerebral angio 08/01/2019 S/P bilateral common carotid arteriograms followed by complete revascularization of RT ICA terminus,RT MCA prox and RT ACA prox with x 1 pass with 4mm x 40 mm X soliaire retriever device and penumbra aspiration  Achieving a TICI 3 revascularization.  PHYSICAL EXAM    Temp:  [97.4 F (36.3  C)-98.8 F (37.1 C)] 97.8 F (36.6 C) (12/30 0743) Pulse Rate:  [67-77] 67 (12/30 0800) Resp:  [20-26] 23 (12/30 0800) BP: (82-142)/(49-61) 82/57 (12/30 0800) SpO2:  [93 %-99 %] 96 % (12/30 0800)  General - Well nourished, well developed, in no apparent distress.  Ophthalmologic - fundi not visualized due to noncooperation.  Cardiovascular - irregularly irregular heart rate and rhythm.  Neuro - sleepy but easily arousable, orientated to self, age, place, but not to time. Eyes open, no aphasia, mild dysarthria, following all simple commands. Able to name and repeat. Bilateral full gaze, no eye deviation, visual field full, no hemianopia. Tracking bilaterally. PERRL. Left facial droop. Tongue midline. RUE 5/5 and RLE 3+/5 proximal due to groin and knee pain, 5/5 distally. LUE 3/5 proximal and distal. LLE 3/5 proximal and 4/5 distal. Left positive babinski. DTR 1+. Sensation symmetrical. Right FTN intact. Gait not tested.    ASSESSMENT/PLAN Ms. Tama HeadingsLorene Boomer is a 85 y.o. female with history of previous stroke in Sep with full recovery and resuming all activities of daily life (mRS 0). Now presenting with acute onset of left hemiplegia, left hemineglect, left  facial droop, left hemianopsia, dysarthria and anosognosia, NIHSS 19. She was not able to get IV tPA d/t outside of time window, but did proceeded to [facility] for thrombectomy with TICI 3 revascularization.  Stroke: R MCA stroke with right tICA, MCA, ACA occlusion s/p IR w/ hemorrhagic transformation, likely due to new diagnosis of Afib  CT Head -  Non acute.  ASPECTS 9. hyperdense R MCA  CTA H&N - acute occlusion of terminal RICA extending to R M1/A1. Bilat carotid athero dz w/o significant stenosis.   Cerebral angio R ICA, R MCA, R ACA occlusions with TICI 3 revascularization   MRI head hemorrhagic infarct R putamen and R caudate into R temporal lobe. Acute infarcts R frontal lobe and R occipital lobes. Small vessel disease.    Repeat CT  head 12/24 evolving infarct R basal ganglia and adjacent white matter w/ contrast staining vs hemorrhage. Additional R temporal lobe infarcts. Mild mass effect.   Repeat CT in am to follow up of hemorrhagic conversion  2D Echo - normal ejection fraction.  No cardiac source of embolism.    LDL - 50  HgbA1c - 6.9  UDS neg  VTE prophylaxis - Lovenox 40 mg sq daily    ASA and Plavix prior to admission, now on ASA 325. Plan for DOAC in 10-14 days post stroke given hemorrhagic conversion seen on CT  Therapy recommendations:  SNF  Disposition: [facility] offered bed, pt accepted, no bed available until 12/31. Will d/c tomorrow  COVID testing for SNF - neg   Atrial Fibrillation, new diagnosis  Dx confirmed PTA w/ patch  On pacerone 200 daily  CHA2DS2-VASc Score = at least 7, ?2 oral anticoagulation recommended  Age in Years:  ?40   +2    Sex:  Female   Female   +1    Hypertension History:  yes   +1     Diabetes Mellitus:  yes   +1  Congestive Heart Failure History:  0  Vascular Disease History:  0     Stroke/TIA/Thromboembolism History:  yes   +2 . Plan AC in 10-14 days post stroke if remains stable given hemorrhagic conversion   Hypertension  Home BP meds: Cozaar  Current BP meds: Cozaar  BP Stable . BP goal normotensive  Hyperlipidemia  Home Lipid lowering medication: lipitor 20  LDL 50, goal < 70  Current lipid lowering medication: lipitor 20  High intensity statin not indicated as below goal and advanced age  Diabetes  Home diabetic meds: Levemir, Novolog  HgbA1c 6.9, goal < 7.0  SSI   On levemir  CBG monitoring - stable  Fever and leukocytosis  WBC 11.4 -> 10.2->13.8->9.1->10.1   Tmax - 101.1 -> afebrile   UA neg  blood culture NGTD   CXR Left base opacification likely effusion with atelectasis. Infection in the left base is possible.  Dysphagia . Secondary to stroke . Speech on board . On dys 1 diet with nectar thick     Urinary  retention  Abdominal pain due to lower abdominal extension 12/28  Bladder scan  I&O PRN  Consider foley if I&O more than 3 times -> foley placed 12/30   Other Stroke Risk Factors  Advanced age  Hx stroke/TIA - stroke 04/2019 without residue as per chart - no details  Active Problems   Acute blood loss anemia IR IR 13.3->11.4->11.5->11.9->10.5->12.3   Right hip pain - severe right hip osteoarthritis on XR  Right knee and dorsal foot pain - tylenol  PRN  Constipation - suppository and fleet enema  Hospital day # 8  Rosalin Hawking, MD PhD Stroke Neurology 08/08/2019 3:00 PM   To contact Stroke Continuity provider, please refer to [email or web address] After hours, contact General Neurology

## 2019-08-08 NOTE — Progress Notes (Signed)
Occupational Therapy Treatment Patient Details Name: Veronica Holland MRN: [id number] DOB: [birth year] Today's Date: 08/08/2019    History of present illness 85 yo female s/p IR revasularization of R MCA with CT (+) R MCA occlusio. Head CT 12/24-Evolving acute infarction of the right basal ganglia and adjacent white matter with hyperdensity reflecting contrast staining and/orhemorrhage. Additional evolving acute infarction of the anterior right temporal lobe. PMH :  stroke, DM   OT comments  Pt making steady progress towards OT goals this session. Session focus on functional mobility and toilet transfer/ hygiene. Upon OT arrival, NT assisting pt with toileting. Assisted pt to [facility] from EOB with MAX A +2. Pt able to initially assist with powering up into standing, but required assist to maintain upright posture and initiate pivotal steps to [facility]. Pt able to complete UB/LB bathing while on BSC needing MIN A for UB bathing and MAX A for LB bathing. Pt requires total A for posterior pericare in standing. Transferred pt back to bed in similar fashion with all needs met. Will continue to follow.    Follow Up Recommendations  CIR    Equipment Recommendations  Other (comment)(defer to next venue of care)    Recommendations for Other Services      Precautions / Restrictions Precautions Precautions: Fall Restrictions Weight Bearing Restrictions: No       Mobility Bed Mobility Overal bed mobility: Needs Assistance Bed Mobility: Supine to Sit     Supine to sit: Max assist;+2 for physical assistance;HOB elevated     General bed mobility comments: MAX A +2 to initiate BLEs to EOB, assist to elevate trunk into sitting with HOB elevated  Transfers Overall transfer level: Needs assistance Equipment used: 2 person hand held assist;1 person hand held assist Transfers: Sit to/from Stand Sit to Stand: Mod assist;+2 physical assistance;Max assist   Squat pivot transfers: Max assist;+2 physical  assistance     General transfer comment: pt able to assist to with BUEs to stand but requires assist to initiate pivotal steps, especially with LLE. Pt requires MAX A +2 overall for stand pivot from EOB<>bSC    Balance Overall balance assessment: Needs assistance Sitting-balance support: Single extremity supported;Feet supported Sitting balance-Leahy Scale: Fair     Standing balance support: Bilateral upper extremity supported Standing balance-Leahy Scale: Poor Standing balance comment: reliant on external support                           ADL either performed or assessed with clinical judgement   ADL Overall ADL's : Needs assistance/impaired     Grooming: Wash/dry face;Sitting;Supervision/safety;Set up   Upper Body Bathing: Minimal assistance;Sitting Upper Body Bathing Details (indicate cue type and reason): MIN A to wash RUE with LUE Lower Body Bathing: Maximal assistance   Upper Body Dressing : Minimal assistance;Sitting   Lower Body Dressing: Maximal assistance   Toilet Transfer: Maximal assistance;+2 for physical assistance;BSC;Stand-pivot Toilet Transfer Details (indicate cue type and reason): MAX A +2 to pivot to BSC. assist to initiate pivotal steps Toileting- Clothing Manipulation and Hygiene: Total assistance;+2 for physical assistance;Sit to/from stand       Functional mobility during ADLs: Maximal assistance;+2 for physical assistance General ADL Comments: session focus on stand pivot transfers from EOB<>BSC, pt requires heavy MAX A +2 for functional transfers     Vision       Perception     Praxis      Cognition Arousal/Alertness: Awake/alert Behavior During Therapy: [facility] for  tasks assessed/performed Overall Cognitive Status: No family/caregiver present to determine baseline cognitive functioning                                 General Comments: follows commands well. pleasant and appreciative        Exercises      Shoulder Instructions       General Comments      Pertinent Vitals/ Pain       Pain Assessment: No/denies pain  Home Living                                          Prior Functioning/Environment              Frequency  Min 3X/week        Progress Toward Goals  OT Goals(current goals can now be found in the care plan section)  Progress towards OT goals: Progressing toward goals  Acute Rehab OT Goals Patient Stated Goal: to get rehab OT Goal Formulation: With patient Time For Goal Achievement: 08/15/19 Potential to Achieve Goals: Good  Plan Discharge plan remains appropriate    Co-evaluation                 AM-PAC OT "6 Clicks" Daily Activity     Outcome Measure   Help from another person eating meals?: A Lot Help from another person taking care of personal grooming?: A Lot Help from another person toileting, which includes using toliet, bedpan, or urinal?: A Lot Help from another person bathing (including washing, rinsing, drying)?: A Lot Help from another person to put on and taking off regular upper body clothing?: A Lot Help from another person to put on and taking off regular lower body clothing?: Total 6 Click Score: 11    End of Session Equipment Utilized During Treatment: Gait belt;Rolling walker;Other (comment)(BSC)  OT Visit Diagnosis: Unsteadiness on feet (R26.81);Muscle weakness (generalized) (M62.81)   Activity Tolerance Patient tolerated treatment well   Patient Left in bed;with call bell/phone within reach;with chair alarm set   Nurse Communication Mobility status(wanting to eat)        Time: 8657-8469 OT Time Calculation (min): 31 min  Charges: OT General Charges $OT Visit: 1 Visit OT Treatments $Self Care/Home Management : 23-37 mins  Lanier Clam., COTA/L Acute Rehabilitation Services [phone] [place] 08/08/2019, 4:10 PM

## 2019-08-08 NOTE — Progress Notes (Signed)
  Speech Language Pathology Treatment: Dysphagia  Patient Details Name: Veronica Holland MRN: [id number] DOB: [birth year] Today's Date: 08/08/2019 Time: 3149-7026 SLP Time Calculation (min) (ACUTE ONLY): 15 min  Assessment / Plan / Recommendation Clinical Impression  Pt was seen for skilled ST targeting dysphagia goals.  Pt was requesting to eat upon therapist's arrival as she had not eaten lunch tray yet (per report, pt was not hungry at lunch time today).  Nursing reports excellent toleration of diet advancement with minimal pocketing during breakfast meal this morning.  Pt consumed dys 2 textures and thin liquids on her lunch tray with distant supervision cues for use of swallowing precautions to clear mild oral residue post swallow.  No overt s/s of aspiration were evident with solids or liquids.  Pt was left in bed with bed alarm set and call bell within reach.  Continue per current plan of care.    HPI HPI: 85yo female admitted 07/31/2019 with left weakness, right gaze preference, dysarthria.  PMH: CVA (04/2019) DM2, HTN, PAFib, CKD3, HLD, diverticulosis, OA, peripheral neuropathy. HeadCT = RMCA, early infarct R lateral basal ganglia. Intubated for IR. MRI pending      SLP Plan  Continue with current plan of care       Recommendations  Diet recommendations: Dysphagia 2 (fine chop);Thin liquid Liquids provided via: Cup;Straw Medication Administration: Crushed with puree Supervision: Full supervision/cueing for compensatory strategies Compensations: Minimize environmental distractions;Slow rate;Small sips/bites Postural Changes and/or Swallow Maneuvers: Seated upright 90 degrees;Upright 30-60 min after meal                Oral Care Recommendations: Oral care BID Follow up Recommendations: Inpatient Rehab;Skilled Nursing facility SLP Visit Diagnosis: Dysphagia, unspecified (R13.10) Plan: Continue with current plan of care       GO                PageSelinda Orion 08/08/2019,  4:23 PM

## 2019-08-08 NOTE — Care Management Important Message (Signed)
Important Message  Patient Details  Name: Veronica Holland MRN: [id number] Date of Birth: 21-Sep-1924   Medicare Important Message Given:  Yes     Memory Argue 08/08/2019, 1:16 PM

## 2019-08-08 NOTE — Progress Notes (Signed)
Spoke with the patients Daughter Mardene Celeste this AM. The patients SSN is [id number].

## 2019-08-09 ENCOUNTER — Inpatient Hospital Stay (HOSPITAL_COMMUNITY): Payer: Medicare Other

## 2019-08-09 LAB — CULTURE, BLOOD (ROUTINE X 2)
Culture: NO GROWTH
Culture: NO GROWTH

## 2019-08-09 LAB — GLUCOSE, CAPILLARY
Glucose-Capillary: 124 mg/dL — ABNORMAL HIGH (ref 70–99)
Glucose-Capillary: 103 mg/dL — ABNORMAL HIGH (ref 70–99)
Glucose-Capillary: 244 mg/dL — ABNORMAL HIGH (ref 70–99)

## 2019-08-09 NOTE — Progress Notes (Signed)
Physical Therapy Treatment Patient Details Name: Veronica Holland MRN: [id number] DOB: [birth year] Today's Date: 08/09/2019    History of Present Illness 85 yo female s/p IR revasularization of R MCA with CT (+) R MCA occlusio. Head CT 12/24-Evolving acute infarction of the right basal ganglia and adjacent white matter with hyperdensity reflecting contrast staining and/orhemorrhage. Additional evolving acute infarction of the anterior right temporal lobe. PMH :  stroke, DM    PT Comments    Improving slowly and steadily, R hip/LE continues to hurt.  CT shows nothing at hip. ? Lumbar spine problem?Marland Kitchen.  Emphasis on transitions, sit to stand, standing and pre gait.    Follow Up Recommendations  Supervision/Assistance - 24 hour;SNF     Equipment Recommendations  Other (comment)(TBA)    Recommendations for Other Services       Precautions / Restrictions Precautions Precautions: Fall Restrictions Weight Bearing Restrictions: No    Mobility  Bed Mobility Overal bed mobility: Needs Assistance Bed Mobility: Rolling;Sidelying to Sit Rolling: Mod assist Sidelying to sit: Max assist;Mod assist       General bed mobility comments: assist given slowly to allow pt to assist with UE's.  pt needs assist to help coordinate trunk and extremities.  Transfers Overall transfer level: Needs assistance Equipment used: None Transfers: Sit to/from [facility] Sit to Stand: Mod assist;Max assist(x3) Stand pivot transfers: Mod assist;Max assist       General transfer comment: multimodal cues and assist to come forward and boost.  Ambulation/Gait             General Gait Details: NT   Stairs             Wheelchair Mobility    Modified Rankin (Stroke Patients Only) Modified Rankin (Stroke Patients Only) Modified Rankin: Severe disability     Balance Overall balance assessment: Needs assistance   Sitting balance-Leahy Scale: Fair Sitting balance - Comments:  complete bil shd reaches to ceiling with w/shifting, hip flexion and LAQ all 10 reps while pt balanced EOB without overt LOB, but with minor deviation.   Standing balance support: Bilateral upper extremity supported;No upper extremity supported;During functional activity Standing balance-Leahy Scale: Poor Standing balance comment: pregait activity at EOB, standing upright, w/shifting and alternating heel raises over 1-2 minutes x2                            Cognition Arousal/Alertness: Awake/alert Behavior During Therapy: WFL for tasks assessed/performed Overall Cognitive Status: Within Functional Limits for tasks assessed                                        Exercises Other Exercises Other Exercises: Warm up ROM exercise prior to mobility Other Exercises: as described above, shoulder presses/reaching to the ceiling x10, marching  x10 and LAQ x10 all in sitting,   Self-Assisted shoulder flexion x10 , abd/add x10,  Graded resisted hip/knee flexion /ext. x10    General Comments        Pertinent Vitals/Pain Pain Assessment: Faces Faces Pain Scale: Hurts little more Pain Location: right hip to knee Pain Descriptors / Indicators: Burning;Discomfort Pain Intervention(s): Monitored during session    Home Living                      Prior Function  PT Goals (current goals can now be found in the care plan section) Acute Rehab PT Goals Patient Stated Goal: to get rehab PT Goal Formulation: With patient Time For Goal Achievement: 08/15/19 Potential to Achieve Goals: Good Progress towards PT goals: Progressing toward goals    Frequency    Min 3X/week      PT Plan Current plan remains appropriate    Co-evaluation              AM-PAC PT "6 Clicks" Mobility   Outcome Measure  Help needed turning from your back to your side while in a flat bed without using bedrails?: A Lot Help needed moving from lying on your back to  sitting on the side of a flat bed without using bedrails?: Total Help needed moving to and from a bed to a chair (including a wheelchair)?: Total Help needed standing up from a chair using your arms (e.g., wheelchair or bedside chair)?: Total Help needed to walk in hospital room?: Total Help needed climbing 3-5 steps with a railing? : Total 6 Click Score: 7    End of Session   Activity Tolerance: Patient tolerated treatment well;Patient limited by fatigue;Patient limited by pain Patient left: in chair;with call bell/phone within reach;with chair alarm set Nurse Communication: Mobility status PT Visit Diagnosis: Hemiplegia and hemiparesis;Other abnormalities of gait and mobility (R26.89) Hemiplegia - Right/Left: Left Hemiplegia - dominant/non-dominant: Non-dominant Hemiplegia - caused by: Cerebral infarction     Time: 9242-6834 PT Time Calculation (min) (ACUTE ONLY): 26 min  Charges:  $Therapeutic Activity: 8-22 mins $Neuromuscular Re-education: 8-22 mins                     08/09/2019  Jacinto Halim., PT Acute Rehabilitation Services [phone]  (pager) [phone]  (office)   Veronica Holland 08/09/2019, 11:57 AM

## 2019-08-09 NOTE — Progress Notes (Signed)
Pt picked up by [facility] for transport to facility.

## 2019-08-09 NOTE — Progress Notes (Signed)
RN called report to [facility] health

## 2019-08-09 NOTE — TOC Transition Note (Signed)
Transition of Care [facility]) - CM/SW Discharge Note   Patient Details  Name: Veronica Holland MRN: [id number] Date of Birth: 1925/02/11  Transition of Care [facility]) CM/SW Contact:  Geralynn Ochs, LCSW Phone Number: 08/09/2019, 3:50 PM   Clinical Narrative:   Nurse to call report to [phone], Room 113P.    Final next level of care: [place] Barriers to Discharge: Barriers Resolved   Patient Goals and CMS Choice        Discharge Placement              Patient chooses bed at: [facility] Patient to be transferred to facility by: [place] Name of family member notified: Left voicemail for Mardene Celeste Patient and family notified of of transfer: 08/09/19  Discharge Plan and Services In-house Referral: Clinical Social Work                                   Social Determinants of Health (SDOH) Interventions     Readmission Risk Interventions No flowsheet data found.

## 2019-09-13 ENCOUNTER — Inpatient Hospital Stay: Payer: Medicare Other | Admitting: Adult Health

## 2019-09-13 NOTE — Progress Notes (Deleted)
[facility] [address] [place]. [place] [address] [phone]       HOSPITAL FOLLOW UP NOTE  [name] Date of Birth:  05-27-1925 Medical Record Number:  [id number]   Reason for Referral:  hospital stroke follow up    CHIEF COMPLAINT:  No chief complaint on file.   HPI: Veronica Holland being seen today for in office hospital follow-up regarding right MCA stroke status post right ICA, MCA and ACA revascularization with hemorrhagic transformation likely due to new diagnosis of A. fib on 07/31/2019.  History obtained from *** and chart review. Reviewed all radiology images and labs personally.  Ms.Veronica Holland a 86 y.o.femalewith history of previous stroke in Sep with full recovery and resuming all activities of daily life (mRS 0).  She now presented on 07/31/2019 with acute onset of left hemiplegia, left hemineglect, left facial droop, left hemianopsia, dysarthria and anosognosia, NIHSS 19.  Evaluated by stroke team and both Dr. Erlinda Hong and Dr. Leonie Man with stroke work-up revealing right MCA stroke with right ICA, MCA and ACA occlusion status post IR with hemorrhagic transformation likely due to new diagnosis of A. fib.  CT head no acute abnormality and CTA head/neck showed acute occlusion of terminal right ICA extending to right M1/A1.  She was not able to get IV tPA d/t outside of time window, but did proceeded to [facility] for thrombectomy with TICI 3 revascularization of right ICA, right MCA, right ACA occlusions.  MRI showed hemorrhagic infarct right putaman and right caudate into the right temporal lobe as well as acute infarcts right frontal lobe and right occipital lobes.  Repeat CT head 12/24 showed evolving right basal ganglia and adjacent white matter with contrast staining versus hemorrhage with additional right temporal lobe infarcts and mild mass-effect.  Repeat CT head 12/31 showed decreasing petechial hemorrhage and swelling without new abnormality.  2D echo  normal EF without cardiac source of embolus identified.  Atrial fibrillation recently confirmed PTA with patch.  CHA2DS2-VASc score 7.  Discharged on aspirin with plan on transitioning to Eliquis 5 mg twice daily on 08/13/2019 if remains neurologically stable due to hemorrhagic conversion.  Advised to discontinue aspirin once Eliquis initiated.  HTN stable and continue home dose Cozaar.  LDL 50 and recommended continuation of home dose atorvastatin 20 mg daily.  Controlled DM with A1c 6.9.  Other stroke risk factors include advanced age and prior history of stroke 04/2019 without residual deficits.  Other active problems include acute blood loss anemia, right hip pain 2/2 severe right hip osteoarthritis, right knee and dorsal foot pain and constipation.  Hospital course complicated by urinary retention and discharge with Foley catheter, dysphagia post stroke cleared for dysphagia 1 NTL diet, and resolution of fever and leukocytosis.  Evaluated by therapies and recommended discharge to SNF for ongoing therapy.     ROS:   14 system review of systems performed and negative with exception of ***  PMH:  Past Medical History:  Diagnosis Date  . Diabetes mellitus without complication ([place])   . Stroke [facility])     PSH:  Past Surgical History:  Procedure Laterality Date  . HYSTERECTOMY ABDOMINAL WITH SALPINGECTOMY    . IR ANGIO INTRA EXTRACRAN SEL COM CAROTID INNOMINATE UNI L MOD SED  08/01/2019  . IR ANGIO VERTEBRAL SEL SUBCLAVIAN INNOMINATE UNI R MOD SED  08/01/2019  . IR CT HEAD LTD  08/01/2019  . IR PERCUTANEOUS ART THROMBECTOMY/INFUSION INTRACRANIAL INC DIAG ANGIO  08/01/2019  . RADIOLOGY WITH ANESTHESIA N/A  07/31/2019   Procedure: IR WITH ANESTHESIA;  Surgeon: Julieanne Cotton, MD;  Location: [facility] OR;  Service: Radiology;  Laterality: N/A;    Social History:  Social History   Socioeconomic History  . Marital status: Single    Spouse name: Not on file  . Number of children: Not on file  .  Years of education: Not on file  . Highest education level: Not on file  Occupational History  . Not on file  Tobacco Use  . Smoking status: Never Smoker  . Smokeless tobacco: Never Used  Substance and Sexual Activity  . Alcohol use: Not Currently  . Drug use: Never  . Sexual activity: Not on file  Other Topics Concern  . Not on file  Social History Narrative  . Not on file   Social Determinants of Health   Financial Resource Strain:   . Difficulty of Paying Living Expenses: Not on file  Food Insecurity:   . Worried About Programme researcher, broadcasting/film/video in the Last Year: Not on file  . Ran Out of Food in the Last Year: Not on file  Transportation Needs:   . Lack of Transportation (Medical): Not on file  . Lack of Transportation (Non-Medical): Not on file  Physical Activity:   . Days of Exercise per Week: Not on file  . Minutes of Exercise per Session: Not on file  Stress:   . Feeling of Stress : Not on file  Social Connections:   . Frequency of Communication with Friends and Family: Not on file  . Frequency of Social Gatherings with Friends and Family: Not on file  . Attends Religious Services: Not on file  . Active Member of Clubs or Organizations: Not on file  . Attends Banker Meetings: Not on file  . Marital Status: Not on file  Intimate Partner Violence:   . Fear of Current or Ex-Partner: Not on file  . Emotionally Abused: Not on file  . Physically Abused: Not on file  . Sexually Abused: Not on file    Family History: No family history on file.  Medications:   Current Outpatient Medications on File Prior to Visit  Medication Sig Dispense Refill  . amiodarone (PACERONE) 200 MG tablet Take 200 mg by mouth daily.    Marland Kitchen aspirin EC 325 MG EC tablet Take 1 tablet (325 mg total) by mouth daily. Be sure to stop aspirin once started on anticoagulation 10-14 days post stroke 30 tablet 0  . atorvastatin (LIPITOR) 20 MG tablet Take 20 mg by mouth daily.    .  calcium-vitamin D (OSCAL WITH D) 500-200 MG-UNIT tablet Take 1 tablet by mouth daily with breakfast.    . insulin detemir (LEVEMIR) 100 UNIT/ML injection Inject 25 Units into the skin daily.    Marland Kitchen latanoprost (XALATAN) 0.005 % ophthalmic solution Place 1 drop into both eyes at bedtime.    Marland Kitchen losartan (COZAAR) 25 MG tablet Take 25 mg by mouth daily.     No current facility-administered medications on file prior to visit.    Allergies:   Allergies  Allergen Reactions  . Bee Venom Other (See Comments)    Hives Hives   . Penicillin G Other (See Comments) and Rash    Unknown Unknown      Physical Exam  There were no vitals filed for this visit. There is no height or weight on file to calculate BMI. No exam data present  No flowsheet data found.   General: well developed, well  nourished, seated, in no evident distress Head: head normocephalic and atraumatic.   Neck: supple with no carotid or supraclavicular bruits Cardiovascular: regular rate and rhythm, no murmurs Musculoskeletal: no deformity Skin:  no rash/petichiae Vascular:  Normal pulses all extremities   Neurologic Exam Mental Status: Awake and fully alert. Oriented to place and time. Recent and remote memory intact. Attention span, concentration and fund of knowledge appropriate. Mood and affect appropriate.  Cranial Nerves: Fundoscopic exam reveals sharp disc margins. Pupils equal, briskly reactive to light. Extraocular movements full without nystagmus. Visual fields full to confrontation. Hearing intact. Facial sensation intact. Face, tongue, palate moves normally and symmetrically.  Motor: Normal bulk and tone. Normal strength in all tested extremity muscles. Sensory.: intact to touch , pinprick , position and vibratory sensation.  Coordination: Rapid alternating movements normal in all extremities. Finger-to-nose and heel-to-shin performed accurately bilaterally. Gait and Station: Arises from chair without difficulty.  Stance is normal. Gait demonstrates normal stride length and balance Reflexes: 1+ and symmetric. Toes downgoing.     NIHSS  *** Modified Rankin  *** CHA2DS2-VASc *** HAS-BLED ***   Diagnostic Data (Labs, Imaging, Testing)  CT HEAD WO CONTRAST 07/31/2019 IMPRESSION: 1. Hyperdense right MCA compatible with acute thrombus. Probable early infarct right lateral basal ganglia 2. ASPECTS is 9  08/02/2019 IMPRESSION: Evolving acute infarction of the right basal ganglia and adjacent white matter with hyperdensity reflecting contrast staining and/or hemorrhage. Additional evolving acute infarction of the anterior right temporal lobe. Mild mass effect is present.  08/09/2019 IMPRESSION: Decreasing petechial hemorrhage and swelling.  No new abnormality.  CT ANGIO HEAD W OR WO CONTRAST CT ANGIO NECK W OR WO CONTRAST CT CEREBRAL PERFUSION W CONTRAST 07/31/2019 IMPRESSION: 1. 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 There is collateral circulation with decreased perfusion in the right MCA territory. 2. CT perfusion demonstrates 8 mL of core infarct in the right basal ganglia. 95 mL of penumbra in the right temporoparietal lobe. 3. 50% diameter stenosis proximal left subclavian artery. 4. Right carotid atherosclerotic disease without significant stenosis. 50% diameter stenosis proximal left internal carotid Artery.  CEREBRAL ANGIOGRAM 07/31/2019 IMPRESSION:  Status post endovascular complete revascularization of the right  internal carotid artery terminus, right middle cerebral artery and  right anterior cerebral distribution with 1 pass with the 4 mm x 40  mm Solitaire X retrieval device with Penumbra aspiration achieving a  TICI 3 revascularization.    MR BRAIN WO CONTRAST 08/01/2019 IMPRESSION: Acute hemorrhagic infarction in the right putamen and right caudate. Acute infarct extends into the right  temporal lobe. Small areas of acute infarct right frontal and right occipital lobe Mild chronic microvascular ischemic change in the white matter.  ECHOCARDIOGRAM 08/01/2019 IMPRESSIONS  1. Left ventricular ejection fraction, by visual estimation, is 55 to  60%. The left ventricle has normal function. There is no left ventricular  hypertrophy.  2. Definity contrast agent was given IV to delineate the left ventricular  endocardial borders.  3. Elevated left atrial pressure.  4. Left ventricular diastolic parameters are consistent with Grade II  diastolic dysfunction (pseudonormalization).  5. Global right ventricle has normal systolic function.The right  ventricular size is normal.  6. Left atrial size was normal.  7. Right atrial size was normal.  8. Mild mitral annular calcification.  9. The mitral valve is normal in structure. Trivial mitral valve  regurgitation. No evidence of mitral stenosis.  10. The tricuspid valve is normal  in structure.  11. The aortic valve is tricuspid. Aortic valve regurgitation is not  visualized. Mild aortic valve sclerosis without stenosis.  12. The pulmonic valve was normal in structure. Pulmonic valve  regurgitation is trivial.  13. The inferior vena cava is normal in size with greater than 50%  respiratory variability, suggesting right atrial pressure of 3 mmHg.  14. Definity used; normal LV systolic function; grade 2 diastolic  dysfunction.      ASSESSMENT: Veronica Holland is a 86 y.o. year old female presented with left hemiplegia, left hemineglect, left facial droop, left hemianopsia, dysarthria and anosognosia on 07/31/2019 with stroke work-up revealing right MCA stroke with right ICA, MCA and ACA occlusion status post IR with TICI 3 revascularization with hemorrhagic transformation, stroke likely secondary to newly diagnosed A. fib.  Recommended initiating Eliquis 5 mg twice daily 08/13/2019 if remains neurologically stable due to  hemorrhagic transformation.  Vascular risk factors include recent stroke 04/2019, HTN, HLD, DM, atrial fibrillation, and advanced age.     PLAN:  1. Right MCA stroke: Continue {anticoagulants:31417}  and ***  for secondary stroke prevention. Maintain strict control of hypertension with blood pressure goal below 130/90, diabetes with hemoglobin A1c goal below 6.5% and cholesterol with LDL cholesterol (bad cholesterol) goal below 70 mg/dL.  I also advised the patient to eat a healthy diet with plenty of whole grains, cereals, fruits and vegetables, exercise regularly with at least 30 minutes of continuous activity daily and maintain ideal body weight. 2. HTN: Advised to continue current treatment regimen.  Today's BP ***.  Advised to continue to monitor at home along with continued follow-up with PCP for management 3. HLD: Advised to continue current treatment regimen along with continued follow-up with PCP for future prescribing and monitoring of lipid panel 4. DMII: Advised to continue to monitor glucose levels at home along with continued follow-up with PCP for management and monitoring 5. Atrial fibrillation:    Follow up in *** or call earlier if needed   Greater than 50% of time during this 45 minute visit was spent on counseling, explanation of diagnosis of right MCA stroke, reviewing risk factor management of HTN, HLD, DM and atrial fibrillation, planning of further management along with potential future management, and discussion with patient and family answering all questions.    Ihor Austin, AGNP-BC  [facility] [address] [place], [place] [address]  Phone [phone] Fax [phone] Note: This document was prepared with digital dictation and possible smart phrase technology. Any transcriptional errors that result from this process are unintentional.

## 2019-09-17 ENCOUNTER — Encounter: Payer: Self-pay | Admitting: Adult Health

## 2019-10-17 ENCOUNTER — Other Ambulatory Visit: Payer: Self-pay

## 2019-10-17 ENCOUNTER — Encounter (HOSPITAL_COMMUNITY): Payer: Self-pay | Admitting: Emergency Medicine

## 2019-10-17 ENCOUNTER — Emergency Department (HOSPITAL_COMMUNITY): Payer: Medicare Other

## 2019-10-17 ENCOUNTER — Inpatient Hospital Stay (HOSPITAL_COMMUNITY)
Admission: EM | Admit: 2019-10-17 | Discharge: 2019-11-08 | DRG: 871 | Disposition: E | Payer: Medicare Other | Source: Skilled Nursing Facility | Attending: Emergency Medicine | Admitting: Emergency Medicine

## 2019-10-17 DIAGNOSIS — Z20822 Contact with and (suspected) exposure to covid-19: Secondary | ICD-10-CM | POA: Diagnosis present

## 2019-10-17 DIAGNOSIS — I10 Essential (primary) hypertension: Secondary | ICD-10-CM | POA: Diagnosis present

## 2019-10-17 DIAGNOSIS — I69391 Dysphagia following cerebral infarction: Secondary | ICD-10-CM

## 2019-10-17 DIAGNOSIS — A419 Sepsis, unspecified organism: Secondary | ICD-10-CM | POA: Diagnosis present

## 2019-10-17 DIAGNOSIS — J9811 Atelectasis: Secondary | ICD-10-CM | POA: Diagnosis present

## 2019-10-17 DIAGNOSIS — Z79899 Other long term (current) drug therapy: Secondary | ICD-10-CM

## 2019-10-17 DIAGNOSIS — G9341 Metabolic encephalopathy: Secondary | ICD-10-CM | POA: Diagnosis present

## 2019-10-17 DIAGNOSIS — J9601 Acute respiratory failure with hypoxia: Secondary | ICD-10-CM | POA: Diagnosis present

## 2019-10-17 DIAGNOSIS — N320 Bladder-neck obstruction: Secondary | ICD-10-CM | POA: Diagnosis present

## 2019-10-17 DIAGNOSIS — I4891 Unspecified atrial fibrillation: Secondary | ICD-10-CM | POA: Diagnosis present

## 2019-10-17 DIAGNOSIS — Z9079 Acquired absence of other genital organ(s): Secondary | ICD-10-CM

## 2019-10-17 DIAGNOSIS — I259 Chronic ischemic heart disease, unspecified: Secondary | ICD-10-CM

## 2019-10-17 DIAGNOSIS — Z7901 Long term (current) use of anticoagulants: Secondary | ICD-10-CM

## 2019-10-17 DIAGNOSIS — Z90710 Acquired absence of both cervix and uterus: Secondary | ICD-10-CM | POA: Diagnosis not present

## 2019-10-17 DIAGNOSIS — E872 Acidosis: Secondary | ICD-10-CM | POA: Diagnosis present

## 2019-10-17 DIAGNOSIS — Z79890 Hormone replacement therapy (postmenopausal): Secondary | ICD-10-CM

## 2019-10-17 DIAGNOSIS — R6521 Severe sepsis with septic shock: Secondary | ICD-10-CM | POA: Diagnosis present

## 2019-10-17 DIAGNOSIS — I447 Left bundle-branch block, unspecified: Secondary | ICD-10-CM | POA: Diagnosis present

## 2019-10-17 DIAGNOSIS — Z91030 Bee allergy status: Secondary | ICD-10-CM

## 2019-10-17 DIAGNOSIS — E785 Hyperlipidemia, unspecified: Secondary | ICD-10-CM | POA: Diagnosis present

## 2019-10-17 DIAGNOSIS — E119 Type 2 diabetes mellitus without complications: Secondary | ICD-10-CM | POA: Diagnosis present

## 2019-10-17 DIAGNOSIS — K5900 Constipation, unspecified: Secondary | ICD-10-CM | POA: Diagnosis present

## 2019-10-17 DIAGNOSIS — I69151 Hemiplegia and hemiparesis following nontraumatic intracerebral hemorrhage affecting right dominant side: Secondary | ICD-10-CM | POA: Diagnosis not present

## 2019-10-17 DIAGNOSIS — N170 Acute kidney failure with tubular necrosis: Secondary | ICD-10-CM | POA: Diagnosis present

## 2019-10-17 DIAGNOSIS — Z79891 Long term (current) use of opiate analgesic: Secondary | ICD-10-CM

## 2019-10-17 DIAGNOSIS — Z880 Allergy status to penicillin: Secondary | ICD-10-CM

## 2019-10-17 DIAGNOSIS — Z794 Long term (current) use of insulin: Secondary | ICD-10-CM

## 2019-10-17 DIAGNOSIS — N179 Acute kidney failure, unspecified: Secondary | ICD-10-CM | POA: Diagnosis not present

## 2019-10-17 DIAGNOSIS — N390 Urinary tract infection, site not specified: Secondary | ICD-10-CM | POA: Diagnosis present

## 2019-10-17 DIAGNOSIS — K7201 Acute and subacute hepatic failure with coma: Secondary | ICD-10-CM | POA: Diagnosis present

## 2019-10-17 DIAGNOSIS — E875 Hyperkalemia: Secondary | ICD-10-CM | POA: Diagnosis present

## 2019-10-17 DIAGNOSIS — R0603 Acute respiratory distress: Secondary | ICD-10-CM | POA: Diagnosis present

## 2019-10-17 DIAGNOSIS — Z66 Do not resuscitate: Secondary | ICD-10-CM | POA: Diagnosis present

## 2019-10-17 LAB — COMPREHENSIVE METABOLIC PANEL
ALT: 863 U/L — ABNORMAL HIGH (ref 0–44)
AST: 2672 U/L — ABNORMAL HIGH (ref 15–41)
Albumin: 1.7 g/dL — ABNORMAL LOW (ref 3.5–5.0)
Alkaline Phosphatase: 132 U/L — ABNORMAL HIGH (ref 38–126)
Anion gap: 24 — ABNORMAL HIGH (ref 5–15)
BUN: 43 mg/dL — ABNORMAL HIGH (ref 8–23)
CO2: 8 mmol/L — ABNORMAL LOW (ref 22–32)
Calcium: 7.7 mg/dL — ABNORMAL LOW (ref 8.9–10.3)
Chloride: 106 mmol/L (ref 98–111)
Creatinine, Ser: 3.81 mg/dL — ABNORMAL HIGH (ref 0.44–1.00)
GFR calc Af Amer: 11 mL/min — ABNORMAL LOW (ref >60)
GFR calc non Af Amer: 10 mL/min — ABNORMAL LOW (ref >60)
Glucose, Bld: 208 mg/dL — ABNORMAL HIGH (ref 70–99)
Potassium: 5.8 mmol/L — ABNORMAL HIGH (ref 3.5–5.1)
Sodium: 138 mmol/L (ref 135–145)
Total Bilirubin: 0.7 mg/dL (ref 0.3–1.2)
Total Protein: 5 g/dL — ABNORMAL LOW (ref 6.5–8.1)

## 2019-10-17 LAB — POCT I-STAT 7, (LYTES, BLD GAS, ICA,H+H)
Acid-base deficit: 26 mmol/L — ABNORMAL HIGH (ref 0.0–2.0)
Bicarbonate: 4.7 mmol/L — ABNORMAL LOW (ref 20.0–28.0)
Calcium, Ion: 1.01 mmol/L — ABNORMAL LOW (ref 1.15–1.40)
HCT: 34 % — ABNORMAL LOW (ref 36.0–46.0)
Hemoglobin: 11.6 g/dL — ABNORMAL LOW (ref 12.0–15.0)
O2 Saturation: 99 %
Patient temperature: 96.8
Potassium: 6.9 mmol/L (ref 3.5–5.1)
Sodium: 134 mmol/L — ABNORMAL LOW (ref 135–145)
TCO2: 5 mmol/L — ABNORMAL LOW (ref 22–32)
pCO2 arterial: 20.1 mmHg — ABNORMAL LOW (ref 32.0–48.0)
pH, Arterial: 6.971 — CL (ref 7.350–7.450)
pO2, Arterial: 186 mmHg — ABNORMAL HIGH (ref 83.0–108.0)

## 2019-10-17 LAB — CBC WITH DIFFERENTIAL/PLATELET
Abs Immature Granulocytes: 0.52 10*3/uL — ABNORMAL HIGH (ref 0.00–0.07)
Basophils Absolute: 0.2 10*3/uL — ABNORMAL HIGH (ref 0.0–0.1)
Basophils Relative: 1 %
Eosinophils Absolute: 0 10*3/uL (ref 0.0–0.5)
Eosinophils Relative: 0 %
HCT: 38.6 % (ref 36.0–46.0)
Hemoglobin: 11.4 g/dL — ABNORMAL LOW (ref 12.0–15.0)
Immature Granulocytes: 2 %
Lymphocytes Relative: 3 %
Lymphs Abs: 1 10*3/uL (ref 0.7–4.0)
MCH: 29.4 pg (ref 26.0–34.0)
MCHC: 29.5 g/dL — ABNORMAL LOW (ref 30.0–36.0)
MCV: 99.5 fL (ref 80.0–100.0)
Monocytes Absolute: 0.5 10*3/uL (ref 0.1–1.0)
Monocytes Relative: 1 %
Neutro Abs: 32.3 10*3/uL — ABNORMAL HIGH (ref 1.7–7.7)
Neutrophils Relative %: 93 %
Platelets: 341 10*3/uL (ref 150–400)
RBC: 3.88 MIL/uL (ref 3.87–5.11)
RDW: 14.9 % (ref 11.5–15.5)
WBC Morphology: INCREASED
WBC: 34.5 10*3/uL — ABNORMAL HIGH (ref 4.0–10.5)
nRBC: 0.1 % (ref 0.0–0.2)

## 2019-10-17 LAB — URINALYSIS, ROUTINE W REFLEX MICROSCOPIC
Bilirubin Urine: NEGATIVE
Glucose, UA: NEGATIVE mg/dL
Ketones, ur: NEGATIVE mg/dL
Nitrite: NEGATIVE
Protein, ur: NEGATIVE mg/dL
RBC / HPF: >50 RBC/hpf — ABNORMAL HIGH (ref 0–5)
Specific Gravity, Urine: 1.013 (ref 1.005–1.030)
pH: 5 (ref 5.0–8.0)

## 2019-10-17 LAB — CBG MONITORING, ED
Glucose-Capillary: 174 mg/dL — ABNORMAL HIGH (ref 70–99)
Glucose-Capillary: 110 mg/dL — ABNORMAL HIGH (ref 70–99)

## 2019-10-17 LAB — PROTIME-INR
INR: 4.6 (ref 0.8–1.2)
Prothrombin Time: 43.5 seconds — ABNORMAL HIGH (ref 11.4–15.2)

## 2019-10-17 LAB — RESPIRATORY PANEL BY RT PCR (FLU A&B, COVID)
Influenza A by PCR: NEGATIVE
Influenza B by PCR: NEGATIVE
SARS Coronavirus 2 by RT PCR: NEGATIVE

## 2019-10-17 LAB — LACTIC ACID, PLASMA
Lactic Acid, Venous: >11 mmol/L (ref 0.5–1.9)
Lactic Acid, Venous: >11 mmol/L (ref 0.5–1.9)

## 2019-10-17 LAB — APTT: aPTT: 44 seconds — ABNORMAL HIGH (ref 24–36)

## 2019-10-17 LAB — POC SARS CORONAVIRUS 2 AG -  ED: SARS Coronavirus 2 Ag: NEGATIVE

## 2019-10-17 LAB — TROPONIN I (HIGH SENSITIVITY): Troponin I (High Sensitivity): 2853 ng/L (ref <18)

## 2019-10-17 MED ORDER — APIXABAN 5 MG PO TABS
5.0000 mg | ORAL_TABLET | Freq: Two times a day (BID) | ORAL | Status: DC
  Filled 2019-10-17: qty 1

## 2019-10-17 MED ORDER — SODIUM CHLORIDE 0.9 % IV SOLN
250.0000 mL | INTRAVENOUS | Status: DC
  Administered 2019-10-17: 09:00:00 250 mL via INTRAVENOUS

## 2019-10-17 MED ORDER — MORPHINE 100MG IN NS 100ML (1MG/ML) PREMIX INFUSION
1.0000 mg/h | INTRAVENOUS | Status: DC
  Administered 2019-10-17: 1 mg/h via INTRAVENOUS
  Filled 2019-10-17: qty 100

## 2019-10-17 MED ORDER — SODIUM CHLORIDE 0.9 % IV SOLN: 2.0000 g | INTRAVENOUS | Status: DC

## 2019-10-17 MED ORDER — SODIUM BICARBONATE 8.4 % IV SOLN
50.0000 meq | Freq: Once | INTRAVENOUS | Status: AC
  Administered 2019-10-17: 11:00:00 50 meq via INTRAVENOUS
  Filled 2019-10-17: qty 50

## 2019-10-17 MED ORDER — LACTATED RINGERS IV BOLUS (SEPSIS)
1000.0000 mL | Freq: Once | INTRAVENOUS | Status: AC
  Administered 2019-10-17: 1000 mL via INTRAVENOUS

## 2019-10-17 MED ORDER — METRONIDAZOLE IN NACL 5-0.79 MG/ML-% IV SOLN
500.0000 mg | Freq: Once | INTRAVENOUS | Status: AC
  Administered 2019-10-17: 09:00:00 500 mg via INTRAVENOUS
  Filled 2019-10-17: qty 100

## 2019-10-17 MED ORDER — VANCOMYCIN HCL IN DEXTROSE 1-5 GM/200ML-% IV SOLN
1000.0000 mg | Freq: Once | INTRAVENOUS | Status: DC
  Filled 2019-10-17: qty 200

## 2019-10-17 MED ORDER — NOREPINEPHRINE 4 MG/250ML-% IV SOLN: 2.0000 ug/min | INTRAVENOUS | Status: DC

## 2019-10-17 MED ORDER — SODIUM CHLORIDE 0.9 % IV SOLN: 1.0000 g | INTRAVENOUS | Status: DC

## 2019-10-17 MED ORDER — SODIUM CHLORIDE 0.9 % IV SOLN
2.0000 g | Freq: Once | INTRAVENOUS | Status: AC
  Administered 2019-10-17: 2 g via INTRAVENOUS
  Filled 2019-10-17: qty 2

## 2019-10-17 MED ORDER — LACTATED RINGERS IV BOLUS (SEPSIS)
250.0000 mL | Freq: Once | INTRAVENOUS | Status: AC
  Administered 2019-10-17: 250 mL via INTRAVENOUS

## 2019-10-17 MED ORDER — SODIUM CHLORIDE 0.9 % IV SOLN: 2.0000 g | Freq: Once | INTRAVENOUS | Status: DC

## 2019-10-17 MED ORDER — NOREPINEPHRINE 4 MG/250ML-% IV SOLN
INTRAVENOUS | Status: AC
  Administered 2019-10-17: 11:00:00 2 ug/min via INTRAVENOUS
  Filled 2019-10-17: qty 250

## 2019-10-17 MED ORDER — FENTANYL CITRATE (PF) 100 MCG/2ML IJ SOLN: 25.0000 ug | INTRAMUSCULAR | Status: DC | PRN

## 2019-10-17 MED ORDER — FENTANYL CITRATE (PF) 100 MCG/2ML IJ SOLN
INTRAMUSCULAR | Status: AC
  Administered 2019-10-17: 14:00:00 100 ug via INTRAVENOUS
  Filled 2019-10-17: qty 2

## 2019-10-17 MED ORDER — VANCOMYCIN VARIABLE DOSE PER UNSTABLE RENAL FUNCTION (PHARMACIST DOSING): Status: DC

## 2019-10-17 MED ORDER — INSULIN ASPART 100 UNIT/ML ~~LOC~~ SOLN: 0.0000 [IU] | SUBCUTANEOUS | Status: DC

## 2019-10-17 MED ORDER — APIXABAN 2.5 MG PO TABS: 2.5000 mg | ORAL_TABLET | Freq: Two times a day (BID) | ORAL | Status: DC

## 2019-10-17 MED ORDER — LACTATED RINGERS IV SOLN: INTRAVENOUS | Status: DC

## 2019-10-17 MED ORDER — VANCOMYCIN HCL 1500 MG/300ML IV SOLN
1500.0000 mg | Freq: Once | INTRAVENOUS | Status: AC
  Administered 2019-10-17: 10:00:00 1500 mg via INTRAVENOUS
  Filled 2019-10-17: qty 300

## 2019-10-19 LAB — CULTURE, BLOOD (ROUTINE X 2)

## 2019-10-19 LAB — URINE CULTURE: Culture: >=100000 — AB

## 2019-10-24 ENCOUNTER — Telehealth: Payer: Self-pay | Admitting: Emergency Medicine

## 2019-10-24 NOTE — Telephone Encounter (Signed)
Spoke with Darl Pikes, she states that Dr. Delton Coombes checked ER outpatient, but they were thinking she passed  Inpatient. I spoke with Dr. Delton Coombes and he states she would be considered inpatient. I called Darl Pikes to let her know. Nothing further is needed.

## 2019-11-08 NOTE — ED Notes (Signed)
Reconfirmed max dose of levo @ 10 w/ Tanja Port NP. Pt currently maxed on levo w/ MAP of 55. NP aware

## 2019-11-08 NOTE — ED Provider Notes (Signed)
[facility]   CSN: [id number] Arrival date & time: 2020/06/19  0813     History Chief Complaint  Patient presents with  . Respiratory Distress    [name] is a 86 y.o. female.  HPI   Patient presents to the ED for evaluation of of altered mental status and respiratory distress.  History was provided by the EMS report as well as the patient's daughter.  Daughter states that yesterday she spoke to the patient and she seemed to be in her usual state.  The nursing staff did tell her however that her blood pressure was running low and they thought she had an infection.  EMS was called to the facility today because the patient was unresponsive.  They noted that she had significant respiratory difficulty.  Her oxygen saturation was extremely low and she had low capnography.  She had a respiratory rate in the 40s and they felt she had rales bilaterally.  They attempted nonrebreather as well as bag-valve-mask ventilation without success.  She was nasally intubated with a 6 tube.  Patient was given 2.5 mg of Versed for sedation and she was also noted to be hypotensive in the 70s.   Past Medical History:  Diagnosis Date  . Diabetes mellitus without complication (HCC)   . Stroke [facility](HCC)     Patient Active Problem List   Diagnosis Date Noted  . Atrial fibrillation (HCC) 08/06/2019  . Essential hypertension 08/06/2019  . Hyperlipidemia 08/06/2019  . Diabetes mellitus type II, controlled (HCC) 08/06/2019  . Urinary retention 08/06/2019  . Right hip and leg pain 08/06/2019  . Constipation 08/06/2019  . 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 Dysphagia, post-stroke   . Controlled type 2 diabetes mellitus with hyperglycemia, without long-term current use of insulin (HCC)   . History of CVA (cerebrovascular accident)   . Acute blood loss anemia   .  Acute left-sided weakness 07/31/2019  . Stroke (cerebrum) (HCC) 07/31/2019    Past Surgical History:  Procedure Laterality Date  . HYSTERECTOMY ABDOMINAL WITH SALPINGECTOMY    . IR ANGIO INTRA EXTRACRAN SEL COM CAROTID INNOMINATE UNI L MOD SED  08/01/2019  . IR ANGIO VERTEBRAL SEL SUBCLAVIAN INNOMINATE UNI R MOD SED  08/01/2019  . IR CT HEAD LTD  08/01/2019  . IR PERCUTANEOUS ART THROMBECTOMY/INFUSION INTRACRANIAL INC DIAG ANGIO  08/01/2019  . RADIOLOGY WITH ANESTHESIA N/A 07/31/2019   Procedure: IR WITH ANESTHESIA;  Surgeon: Julieanne Cottoneveshwar, Sanjeev, MD;  Location: [facility] OR;  Service: Radiology;  Laterality: N/A;     OB History   No obstetric history on file.     No family history on file.  Social History   Tobacco Use  . Smoking status: Never Smoker  . Smokeless tobacco: Never Used  Substance Use Topics  . Alcohol use: Not Currently  . Drug use: Never    Home Medications Prior to Admission medications   Medication Sig Start Date End Date Taking? Authorizing Provider  acetaminophen (TYLENOL) 500 MG tablet Take 1,000 mg by mouth every 8 (eight) hours as needed for moderate pain.   Yes [provider]  amiodarone (PACERONE) 200 MG tablet Take 200 mg by mouth daily. 07/26/19  Yes [provider]  apixaban (ELIQUIS) 5 MG TABS tablet Take 5 mg by mouth 2 (two) times daily.   Yes [provider]  atorvastatin (LIPITOR) 20 MG tablet Take 20 mg by mouth  daily. 05/15/19  Yes [provider]  baclofen (LIORESAL) 10 MG tablet Take 5 mg by mouth 2 (two) times daily.   Yes [provider]  calcium-vitamin D (OSCAL WITH D) 500-200 MG-UNIT tablet Take 1 tablet by mouth daily with breakfast.   Yes [provider]  colchicine 0.6 MG tablet Take 0.6 mg by mouth daily. For 14 days 10/16/19  Yes [provider]  collagenase (SANTYL) ointment Apply 1 application topically daily. For wound care to sacrum area   Yes [provider]    gabapentin (NEURONTIN) 100 MG capsule Take 100 mg by mouth at bedtime.   Yes [provider]  insulin aspart (NOVOLOG) 100 UNIT/ML FlexPen Inject 2-12 Units into the skin 4 (four) times daily -  before meals and at bedtime. Per sliding scale:  150-200= 2 units, 201-250=4 units, 251-300=6 units, 301-350=8 units, 351-400= 10 units. 401-450= 12 units.   Yes [provider]  insulin detemir (LEVEMIR) 100 UNIT/ML injection Inject 32 Units into the skin at bedtime.    Yes [provider]  latanoprost (XALATAN) 0.005 % ophthalmic solution Place 1 drop into both eyes at bedtime. 06/12/19  Yes [provider]  losartan (COZAAR) 25 MG tablet Take 25 mg by mouth daily. 07/05/19  Yes [provider]  Melatonin 3 MG TABS Take 6 mg by mouth at bedtime.   Yes [provider]  Menthol, Topical Analgesic, (BIOFREEZE ROLL-ON EX) Apply 1 application topically at bedtime. Apply to right knee/hip   Yes [provider]  NON FORMULARY Take 120 mLs by mouth 2 (two) times daily between meals. Med-plus 1.7 NSA   Yes [provider]  senna (SENOKOT) 8.6 MG TABS tablet Take 2 tablets by mouth at bedtime.   Yes [provider]  Skin Protectants, Misc. (NO-STING SKIN-PREP EX) Apply 1 application topically in the morning and at bedtime.   Yes [provider]  traMADol (ULTRAM) 50 MG tablet Take 50 mg by mouth every 12 (twelve) hours.   Yes [provider]  Vitamin D, Ergocalciferol, (DRISDOL) 1.25 MG (50000 UNIT) CAPS capsule Take 50,000 Units by mouth every 7 (seven) days. Monday   Yes [provider]  aspirin EC 325 MG EC tablet Take 1 tablet (325 mg total) by mouth daily. Be sure to stop aspirin once started on anticoagulation 10-14 days post stroke Patient not taking: Reported on 2019/10/25 08/09/19   Donzetta Starch, NP    Allergies    Bee venom and Penicillin g  Review of Systems   Review of Systems  Unable to  perform ROS: Mental status change    Physical Exam Updated Vital Signs BP (!) 88/35   Pulse (!) 54   Temp 98.8 F (37.1 C)   Resp (!) 23   SpO2 (!) 89%   Physical Exam Vitals and nursing note reviewed.  Constitutional:      General: She is in acute distress.     Appearance: She is well-developed. She is ill-appearing. She is not diaphoretic.  HENT:     Head: Normocephalic and atraumatic.     Right Ear: External ear normal.     Left Ear: External ear normal.     Nose:     Comments: Nasally intubated    Mouth/Throat:     Mouth: Mucous membranes are dry.     Pharynx: No oropharyngeal exudate or posterior oropharyngeal erythema.  Eyes:     General: No scleral icterus.  Right eye: No discharge.        Left eye: No discharge.     Conjunctiva/sclera: Conjunctivae normal.     Comments: Pupils 2 mm minimally reactive bilaterally  Neck:     Trachea: No tracheal deviation.  Cardiovascular:     Rate and Rhythm: Tachycardia present. Rhythm irregular.  Pulmonary:     Effort: No retractions.     Breath sounds: Normal breath sounds. No stridor. No wheezing or rales.     Comments: Sounds auscultated bilaterally Abdominal:     General: Bowel sounds are normal. There is no distension.     Palpations: Abdomen is soft.     Tenderness: There is no abdominal tenderness. There is no guarding or rebound.     Comments: Fullness in the lower abdomen  Genitourinary:    Comments: Normal external genitalia Musculoskeletal:        General: No tenderness.     Cervical back: Neck supple.     Right lower leg: No edema.     Left lower leg: No edema.  Skin:    General: Skin is warm and dry.     Findings: No rash.  Neurological:     Cranial Nerves: No cranial nerve deficit (no gross facial droop, ).     Sensory: No sensory deficit.     Motor: No abnormal muscle tone or seizure activity.     Comments: Patient unresponsive to verbal or painful stimuli     ED Results / Procedures /  Treatments   Labs (all labs ordered are listed, but only abnormal results are displayed) Labs Reviewed  LACTIC ACID, PLASMA - Abnormal; Notable for the following components:      Result Value   Lactic Acid, Venous >11.0 (*)    All other components within normal limits  COMPREHENSIVE METABOLIC PANEL - Abnormal; Notable for the following components:   Potassium 5.8 (*)    CO2 8 (*)    Glucose, Bld 208 (*)    BUN 43 (*)    Creatinine, Ser 3.81 (*)    Calcium 7.7 (*)    Total Protein 5.0 (*)    Albumin 1.7 (*)    ALT 863 (*)    Alkaline Phosphatase 132 (*)    GFR calc non Af Amer 10 (*)    GFR calc Af Amer 11 (*)    Anion gap 24 (*)    All other components within normal limits  CBC WITH DIFFERENTIAL/PLATELET - Abnormal; Notable for the following components:   Hemoglobin 11.4 (*)    MCHC 29.5 (*)    All other components within normal limits  URINALYSIS, ROUTINE W REFLEX MICROSCOPIC - Abnormal; Notable for the following components:   APPearance CLOUDY (*)    Hgb urine dipstick LARGE (*)    Leukocytes,Ua MODERATE (*)    RBC / HPF >50 (*)    Bacteria, UA MANY (*)    All other components within normal limits  CBG MONITORING, ED - Abnormal; Notable for the following components:   Glucose-Capillary 174 (*)    All other components within normal limits  TROPONIN I (HIGH SENSITIVITY) - Abnormal; Notable for the following components:   Troponin I (High Sensitivity) 2,853 (*)    All other components within normal limits  CULTURE, BLOOD (ROUTINE X 2)  CULTURE, BLOOD (ROUTINE X 2)  URINE CULTURE  LACTIC ACID, PLASMA  APTT  PROTIME-INR  POC SARS CORONAVIRUS 2 AG -  ED  I-STAT ARTERIAL BLOOD GAS, ED  TROPONIN  I (HIGH SENSITIVITY)    EKG EKG Interpretation  Date/Time:  Wednesday 09-Nov-2019 08:16:10 EST Ventricular Rate:  112 PR Interval:    QRS Duration: 132 QT Interval:  402 QTC Calculation: 549 R Axis:   -40 Text Interpretation: Atrial fibrillation Left bundle branch  block Since last tracing rate faster Confirmed by Linwood Dibbles [phone]) on 11/09/19 10:45:27 AM   Radiology DG Chest Port 1 View  Result Date: 11/09/2019 CLINICAL DATA:  Intubated EXAM: PORTABLE CHEST 1 VIEW COMPARISON:  08/04/2019 chest radiograph. FINDINGS: Endotracheal tube tip is 3.2 cm above the carina. Stable cardiomediastinal silhouette with normal heart size. No pneumothorax. No pleural effusion. Mild curvilinear scarring versus atelectasis at the left lung base. Otherwise clear lungs with no pulmonary edema. IMPRESSION: 1. Well-positioned endotracheal tube. 2. Mild curvilinear scarring versus atelectasis at the left lung base. Otherwise no active cardiopulmonary disease. Electronically Signed   By: Delbert Phenix M.D.   On: 11/09/2019 09:06    Procedures .Critical Care Performed by: Linwood Dibbles, MD Authorized by: Linwood Dibbles, MD   Critical care provider statement:    Critical care time (minutes):  60   Critical care was time spent personally by me on the following activities:  Discussions with consultants, evaluation of patient's response to treatment, examination of patient, ordering and performing treatments and interventions, ordering and review of laboratory studies, ordering and review of radiographic studies, pulse oximetry, re-evaluation of patient's condition, obtaining history from patient or surrogate and review of old charts   (including critical care time)  Medications Ordered in ED Medications  vancomycin (VANCOREADY) IVPB 1500 mg/300 mL (1,500 mg Intravenous New Bag/Given November 09, 2019 0954)  0.9 %  sodium chloride infusion (0 mLs Intravenous Stopped November 09, 2019 1018)  norepinephrine (LEVOPHED) 4mg  in premix infusion (3 mcg/min Intravenous Rate/Dose Change 11/09/19 1043)  ceFEPIme (MAXIPIME) 2 g in sodium chloride 0.9 % 100 mL IVPB (has no administration in time range)  vancomycin variable dose per unstable renal function (pharmacist dosing) (has no administration in time range)   sodium bicarbonate injection 50 mEq (has no administration in time range)  lactated ringers bolus 1,000 mL (1,000 mLs Intravenous New Bag/Given 09-Nov-2019 0852)    And  lactated ringers bolus 1,000 mL (0 mLs Intravenous Stopped 11-09-2019 0952)    And  lactated ringers bolus 250 mL (250 mLs Intravenous New Bag/Given 11-09-19 1018)  metroNIDAZOLE (FLAGYL) IVPB 500 mg (0 mg Intravenous Stopped 2019-11-09 1018)  ceFEPIme (MAXIPIME) 2 g in sodium chloride 0.9 % 100 mL IVPB (0 g Intravenous Stopped 11-09-19 12/17/19)    ED Course  I have reviewed the triage vital signs and the nursing notes.  Pertinent labs & imaging results that were available during my care of the patient were reviewed by me and considered in my medical decision making (see chart for details).  Clinical Course as of Oct 17 1046  Wed 2019/11/09  0902 Foley catheter placed.  Patient has obvious bladder outlet obstruction.  Almost 800 cc of cloudy opaque urine in the Foley bag.  Abdomen reexamined and the palpable mass is no longer present   [JK]  0944 Brief bedside 0903 performed to assess for IJ line.  Complete collapse of IJ noted with respiration, suspect significant volume depletion   [JK]  0946 Chest x-ray reviewed.  No evidence of pulmonary edema.  Successful nasotracheal intubation by EMS   [JK]  0946 Most recent blood pressure is improved to 94/70.   [JK]  1030 BP dropped again.  O2 sat also decreasing.  Will consult critical care.  Pt with profound sepsis, renal failure, multisystem organ failure.  Prognosis is poor   [JK]  1031 Unable to obtain abg.  O2 sat improved.  Suspect inaccurate pulse ox   [JK]  1043 Updated daughter on severity of illness.   Pt is DNR.  Continue measures for now.     [JK]    Clinical Course User Index [JK] Linwood Dibbles, MD   MDM Rules/Calculators/A&P                      Patient presented to ED with altered mental status.  Patient was obtunded upon EMS arrival.  She was intubated and was given IV  fluids initially.  In the ED patient was notably hypotensive and unresponsive.  She had a nasal intubation successfully performed by EMS.  She appears to be oxygenating well and the tube is appropriately placed.  No evidence of pneumonia or pulmonary edema.  Patient clinically appears intravascularly depleted.  Laboratory test unfortunately show multisystem organ failure.  She has evidence of acute kidney injury, myocardial ischemia, and hepatic injury.  Patient had bladder outlet obstruction that was treated with a Foley catheter.  Urinalysis appears infected consistent with urosepsis.  We will continue with IV fluids and antibiotics.  Patient is currently DNR but she is intubated.  I have initiated peripheral vasopressors and we will continue with fluid resuscitation.  Once daughter arrives would consider having discussion regarding transition to palliative care.  Plan on admission to the ICU for now.  Final Clinical Impression(s) / ED Diagnoses Final diagnoses:  Sepsis, due to unspecified organism, unspecified whether acute organ dysfunction present [facility])  Bladder outlet obstruction  AKI (acute kidney injury) (HCC)  Myocardial ischemia      Linwood Dibbles, MD 11/12/19 1048

## 2019-11-08 NOTE — Progress Notes (Signed)
PCCM interval note  Follow-up ABG noted.  Patient with marginal blood pressure on norepinephrine 10, profoundly acidotic.  Now with an agonal respiratory pattern.  Lactic acid has not cleared.  Prognosis for recovery very poor.  I spoke with the patient's daughter Veronica Holland by phone, notified her that her mother was not responding to current therapy and that I am concerned she will not survive through the night.  I have asked her to travel safely to the hospital so that she can see her as soon as feasible.  She asked that I speak with the patient's grandson [name] PA, and I will attempt to do so.  Levy Pupa, MD, PhD 25-Oct-2019, 4:33 PM  Pulmonary and Critical Care [phone] or if no answer [phone]

## 2019-11-08 NOTE — ED Notes (Signed)
Pt noted to be asystole on monitor, entered room to assess. Pt not breathing over vent, clearly asystolic. No heart sounds on ausculation and no palpable carotid pulse. Family at bedside, MD Madilyn Hook to bedside to assess. TOD 1754 per MD Madilyn Hook. CCM doc notified. Chaplain at bedside w/ family

## 2019-11-08 NOTE — ED Notes (Signed)
Pt's O2 sats decreased to 83%, HR now in 50s. RT called, Dr. Lynelle Doctor aware.

## 2019-11-08 NOTE — ED Notes (Signed)
Paged Critical Care per RN Pattricia Boss

## 2019-11-08 NOTE — ED Notes (Signed)
Daughter called in stating that the son was let known that a physician was going to  Update him about his grandmother which is the patient. The son, [name] would like to speak to a physician at [phone]

## 2019-11-08 NOTE — Progress Notes (Signed)
MD ok with no ABG due to change in code status.

## 2019-11-08 NOTE — Progress Notes (Signed)
Patient had passed and was extubated per family request.

## 2019-11-08 NOTE — ED Notes (Signed)
Cindee Lame NP w/ CCM at bedside discussing goals of care w/ daughter

## 2019-11-08 NOTE — ED Notes (Signed)
Pt vital signs trending down, pressures in the 30s-50s systolic, HR now in the 50s w/ a wide complex. PA Tanja Port made aware of change in status, no new orders based on code status. RT contacted MD Byram re: results of ABG, Byram speaking w/ family at this time.

## 2019-11-08 NOTE — Progress Notes (Signed)
Pharmacy Antibiotic Note  Veronica Holland is a 86 y.o. female admitted on 11/04/19 with sepsis.  Pharmacy has been consulted for vancomycin and aztreonam dosing.  Has tolerated cephalosporins in past, will change to cefepime.  SCr 3.81, baseline ~ 0.8  Plan: Vancomycin 1500 mg IV x 1, then variable dosing based on renal function Cefepime 2g IV every 24 hours Monitor renal function, Cx and clinical progression to narrow Vancomycin levels at steady state      No data recorded.  No results for input(s): WBC, CREATININE, LATICACIDVEN, VANCOTROUGH, VANCOPEAK, VANCORANDOM, GENTTROUGH, GENTPEAK, GENTRANDOM, TOBRATROUGH, TOBRAPEAK, TOBRARND, AMIKACINPEAK, AMIKACINTROU, AMIKACIN in the last 168 hours.  CrCl cannot be calculated (Patient's most recent lab result is older than the maximum 21 days allowed.).    Allergies  Allergen Reactions  . Bee Venom Other (See Comments)    Hives Hives   . Penicillin G Other (See Comments) and Rash    Unknown Unknown     Antimicrobials this admission: Vanc 3/10>> Cefepime 3/10>>  Dose adjustments this admission: na  Microbiology results: 3/10 BCx: sent 3/10 UCx: sent  [name], PharmD Clinical Pharmacist ED Pharmacist Phone # [phone] 2019-11-04 8:31 AM

## 2019-11-08 NOTE — Progress Notes (Signed)
Chaplain requested for family of patient actively dying.  Death was confirmed as I arrived.  Offered support for family-daughter and husband.  Waiting for grandson to arrive-he is a PA coming from out of town.  Had prayer with the family and documented info needed regarding funeral home -Gave this to nurse already. Family very appreciative.  Phebe Colla, Chaplain   October 27, 2019 1800  Clinical Encounter Type  Visited With Patient;Family  Visit Type Spiritual support;Death  Referral From Nurse  Consult/Referral To Chaplain  Spiritual Encounters  Spiritual Needs Prayer;Emotional  Stress Factors  Family Stress Factors Loss

## 2019-11-08 NOTE — ED Triage Notes (Signed)
Pt arrives via [facility] from [facility], ems reports they were called out for resp distress-symptoms began yesterday, patient had RR of 30-40 with rales heard bilaterally. O2 sats <50% with low capnography , unable to get sats up with BVM or non rebreather. Pt was nasally intubated with 6.0 tube in the field. Initially tachycardic 130-140 bpm-afib on the monitor. Pt received 2.5 mg versed en route by EMS. Pt hypotensive in the 70s upon arrival. IO to L knee.

## 2019-11-08 NOTE — Progress Notes (Signed)
PCCM Interval Note  I was able to reach the patient's grandson, [name], [place]. [phone]. Explained that she is worsening and will likely not survive. He is going to try to get here from [place] to visit. I will start morphine for comfort. We will not formally withdraw until we have given family chance to visit. They understand that she may pass before they can all be here.   Levy Pupa, MD, PhD 11-12-19, 5:00 PM [place] [facility] [phone] or if no answer [phone]

## 2019-11-08 NOTE — Progress Notes (Signed)
2 RT's attempted ABG x2 without success. Attempted to notify MD, but couldn't find him. Will continue to try. Nasal ETT resecured with tube holder. RT to monitor.

## 2019-11-08 NOTE — H&P (Signed)
NAME:  Veronica Holland, MRN:  [id number], DOB:  10-28-1924, LOS: 0 ADMISSION DATE:  2019-10-26, CONSULTATION DATE: 3/10 REFERRING MD:  Lynelle Doctor, CHIEF COMPLAINT:  Septic shock and respiratory failure    Brief History   86 year old patient admitted from skilled nursing facility on 3/10 with working diagnosis of urosepsis/septic shock with associated respiratory failure, AKI and acute metabolic encephalopathy  History of present illness   This is a 86 year old white female who resides at skilled nursing facility.  Presented to the emergency room on 3/10 with what was reported by the nursing home facility as about a 24-hour history of progressive respiratory distress and altered sensorium.  EMS was called the morning of 3/10, initial pulse oximetry reading 50% on room air.  She was unresponsive.  They were unable to improve pulse oximetry with bag valve mask ventilation and therefore the patient was nasally intubated with a size 6 endotracheal tube by EMS.  Upon arrival to the emergency room the patient remained unresponsive, heart rate 130s to 140s in atrial fibrillation, she was hypotensive with systolic blood pressure in the 70s. ER course: Administered IV fluid resuscitation Cultures were obtained Foley catheter placed with very cloudy dark brownish colored urine output Initial diagnostic eval:  UA cloudy, large amounts of blood, many bacteria, lactic acid greater than 11, potassium 5.8, BUN 43, creatinine 3.81, these indices were normal 2 months prior.  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 rate (Abnormal) 22, SpO2 100 %.    Vent Mode: PRVC FiO2 (%):  [100 %] 100 % Set Rate:  [12 bmp] 12 bmp Vt Set:  [450 mL] 450 mL PEEP:  [5 cmH20] 5 cmH20 Plateau Pressure:  [18 cmH20] 18 cmH20   Intake/Output Summary (Last 24 hours) at October 26, 2019 1105 Last data filed at 2019/10/26 1018 Gross per 24 hour  Intake 250 ml  Output 1300 ml  Net -1050 ml   There were no vitals filed for this visit.  Examination: General: 86 year old white female currently unresponsive on mechanical ventilation she is on low-dose norepinephrine, remains mottled HENT: Nasally intubated via left nare, mucous membranes are moist, no JVD Lungs: Scattered rhonchi no accessory use currently on full ventilatory support Cardiovascular: Tachycardic regular irregular with atrial fibrillation Abdomen: Soft hypoactive no organomegaly Extremities: Cool, pulses are palpable.  Left IO catheter in place, both lower extremities mottled Neuro: Unresponsive currently, not on sedating drips GU: Brown cloudy appearing urine output  Resolved Hospital Problem list    Assessment & Plan:  Septic shock/MODS, source appears to be urinary tract/urinary tract source leukocytosis -Per ER physician discussion with daughter family does not wish to pursue aggressive interventions beyond what has already been done Plan Admit to intensive care Continue IV hydration Follow-up pending blood and urine culture Continue Vanco and aztreonam as already initiated by EDP Peripheral norepinephrine, but no central access DO NOT RESUSCITATE should she decline further  Acute hypoxic respiratory failure in setting of septic shock/mods, further complicated  by ineffective airway protection Portable chest x-ray personally reviewed, no clear infiltrate, has chronic left basilar atelectasis. Plan Continue full ventilatory support for now We will keep her nasally intubated per the request of the family who did not want exchange of endotracheal tube VAP bundle PAD protocol RASS goal 0 A.m. chest x-ray Follow-up ABG  Acute renal failure Suspect primarily in setting of organ hypoperfusion and shock.  Did have recent normal renal function 2 months prior.  Also question possible obstructive component this seems relieved after placement of Foley catheter Plan Continue IV hydration Holding any antihypertensives Strict intake output Follow-up blood chemistries Renal dose medications Awaiting family to arrive, will consider renal ultrasound however if family decides to withdraw which EDP indicated that might be a consideration further diagnostics not indicated  Hyperkalemia I suspect this is secondary to acidosis Plan Repeating blood chemistry post IV fluid resuscitation  Shock liver Plan Supportive care A.m. LFTs  Anion gap metabolic acidosis in the setting of severe lactic acidosis secondary to sepsis Plan Continuing IV fluids Treat hypotension Repeat lactic acid  Acute metabolic encephalopathy, in the setting of septic shock, further exacerbated by acute kidney injury and hypoxia.  This is superimposed on underlying prior history of CVA with dysphagia Plan Supportive care Minimal sedating medications, however we will use as needed for comfort  History of diabetes Plan Sliding scale insulin Best practice:  Diet: N.p.o. Pain/Anxiety/Delirium protocol (if indicated): 3/10 VAP protocol (if indicated): 3/10 DVT prophylaxis: Subcutaneous heparin GI prophylaxis: PPI Glucose control: Sliding scale insulin Mobility: Bedrest Code Status: DO NOT RESUSCITATE.  No escalation of care Family Communication: Daughter in route to the emergency  room Disposition: Admit to intensive care  Labs   CBC: Recent Labs  Lab 10-23-19 0915  WBC PENDING  NEUTROABS PENDING  HGB 11.4*  HCT 38.6  MCV 99.5  PLT 341    Basic Metabolic Panel: Recent Labs  Lab 10-23-19 0915  NA 138  K 5.8*  CL 106  CO2 8*  GLUCOSE 208*  BUN 43*  CREATININE 3.81*  CALCIUM 7.7*   GFR: CrCl cannot be calculated (Unknown ideal weight.). Recent Labs  Lab Oct 23, 2019 0915  WBC PENDING  LATICACIDVEN >11.0*    Liver Function Tests: Recent Labs  Lab October 23, 2019 0915  AST 2,672*  ALT 863*  ALKPHOS 132*  BILITOT 0.7  PROT 5.0*  ALBUMIN 1.7*   No results for input(s): LIPASE, AMYLASE in the last 168 hours. No results for input(s): AMMONIA in the last 168 hours.  ABG    Component Value Date/Time   TCO2 23 07/31/2019 2133     Coagulation Profile: No results for input(s): INR, PROTIME in the last 168 hours.  Cardiac Enzymes: No results for input(s): CKTOTAL, CKMB, CKMBINDEX, TROPONINI in the last 168 hours.  HbA1C: Hgb A1c MFr Bld  Date/Time Value Ref Range Status  08/01/2019 01:30 AM 6.9 (H) 4.8 - 5.6 % Final    Comment:    (NOTE) Pre diabetes:          5.7%-6.4% Diabetes:              >6.4% Glycemic control for   <7.0% adults with diabetes     CBG: Recent Labs  Lab 10-23-2019 0821  GLUCAP 174*    Review of Systems:   Not able  Past Medical History  She,  has a past medical history of Diabetes mellitus without complication (HCC) and Stroke (  [facility]).   Surgical History    Past Surgical History:  Procedure Laterality Date  . HYSTERECTOMY ABDOMINAL WITH SALPINGECTOMY    . IR ANGIO INTRA EXTRACRAN SEL COM CAROTID INNOMINATE UNI L MOD SED  08/01/2019  . IR ANGIO VERTEBRAL SEL SUBCLAVIAN INNOMINATE UNI R MOD SED  08/01/2019  . IR CT HEAD LTD  08/01/2019  . IR PERCUTANEOUS ART THROMBECTOMY/INFUSION INTRACRANIAL INC DIAG ANGIO  08/01/2019  . RADIOLOGY WITH ANESTHESIA N/A 07/31/2019   Procedure: IR WITH ANESTHESIA;  Surgeon:  Julieanne Cotton, MD;  Location: [facility] OR;  Service: Radiology;  Laterality: N/A;     Social History   reports that she has never smoked. She has never used smokeless tobacco. She reports previous alcohol use. She reports that she does not use drugs.   Family History   Her family history is not on file.   Allergies Allergies  Allergen Reactions  . Bee Venom Other (See Comments)    Hives Hives   . Penicillin G Other (See Comments) and Rash    Unknown Unknown      Home Medications  Prior to Admission medications   Medication Sig Start Date End Date Taking? Authorizing Provider  acetaminophen (TYLENOL) 500 MG tablet Take 1,000 mg by mouth every 8 (eight) hours as needed for moderate pain.   Yes [provider]  amiodarone (PACERONE) 200 MG tablet Take 200 mg by mouth daily. 07/26/19  Yes [provider]  apixaban (ELIQUIS) 5 MG TABS tablet Take 5 mg by mouth 2 (two) times daily.   Yes [provider]  atorvastatin (LIPITOR) 20 MG tablet Take 20 mg by mouth daily. 05/15/19  Yes [provider]  baclofen (LIORESAL) 10 MG tablet Take 5 mg by mouth 2 (two) times daily.   Yes [provider]  calcium-vitamin D (OSCAL WITH D) 500-200 MG-UNIT tablet Take 1 tablet by mouth daily with breakfast.   Yes [provider]  colchicine 0.6 MG tablet Take 0.6 mg by mouth daily. For 14 days 10/16/19  Yes [provider]  collagenase (SANTYL) ointment Apply 1 application topically daily. For wound care to sacrum area   Yes [provider]  gabapentin (NEURONTIN) 100 MG capsule Take 100 mg by mouth at bedtime.   Yes [provider]  insulin aspart (NOVOLOG) 100 UNIT/ML FlexPen Inject 2-12 Units into the skin 4 (four) times daily -  before meals and at bedtime. Per sliding scale:  150-200= 2 units, 201-250=4 units, 251-300=6 units, 301-350=8 units, 351-400= 10 units. 401-450= 12 units.   Yes [provider]  insulin  detemir (LEVEMIR) 100 UNIT/ML injection Inject 32 Units into the skin at bedtime.    Yes [provider]  latanoprost (XALATAN) 0.005 % ophthalmic solution Place 1 drop into both eyes at bedtime. 06/12/19  Yes [provider]  losartan (COZAAR) 25 MG tablet Take 25 mg by mouth daily. 07/05/19  Yes [provider]  Melatonin 3 MG TABS Take 6 mg by mouth at bedtime.   Yes [provider]  Menthol, Topical Analgesic, (BIOFREEZE ROLL-ON EX) Apply 1 application topically at bedtime. Apply to right knee/hip   Yes [provider]  NON FORMULARY Take 120 mLs by mouth 2 (two) times daily between meals. Med-plus 1.7 NSA   Yes [provider]  senna (SENOKOT) 8.6 MG TABS tablet Take 2 tablets by mouth at bedtime.   Yes [provider]  Skin Protectants, Misc. (NO-STING SKIN-PREP EX) Apply 1 application topically in  the morning and at bedtime.   Yes [provider]  traMADol (ULTRAM) 50 MG tablet Take 50 mg by mouth every 12 (twelve) hours.   Yes [provider]  Vitamin D, Ergocalciferol, (DRISDOL) 1.25 MG (50000 UNIT) CAPS capsule Take 50,000 Units by mouth every 7 (seven) days. Monday   Yes [provider]  aspirin EC 325 MG EC tablet Take 1 tablet (325 mg total) by mouth daily. Be sure to stop aspirin once started on anticoagulation 10-14 days post stroke Patient not taking: Reported on 2019-10-29 08/09/19   Donzetta Starch, NP     Critical care time: 40 min      Erick Colace ACNP-BC [place] Pager # [phone] OR # [phone] if no answer

## 2019-11-08 DEATH — deceased

## 2020-09-18 IMAGING — CT CT HEAD W/O CM
4 series · 17 of 47 positions shown, 19 images · non-contrast
Comparison: 08/02/2019

CLINICAL DATA: Stroke follow-up

EXAM:
CT HEAD WITHOUT CONTRAST
TECHNIQUE: Contiguous axial images were obtained from the base of the skull
through the vertex without intravenous contrast.

[Series 3: head wo · axial · 0.42mm/px · z∈[-99,+26]mm · 7 of 35 slices shown, 9 images]
[im 5/35  brain]
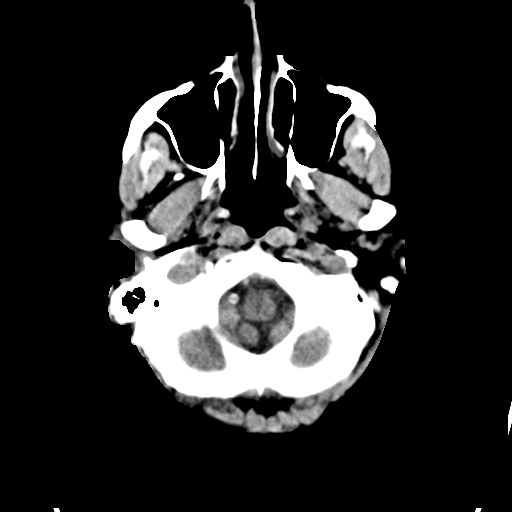
[im 5/35  bone]
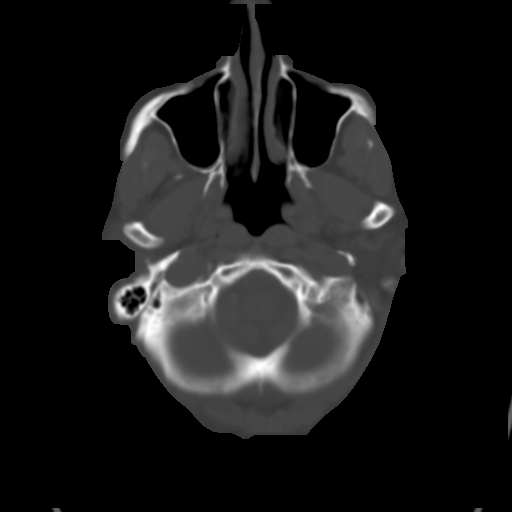
[im 9/35  brain]
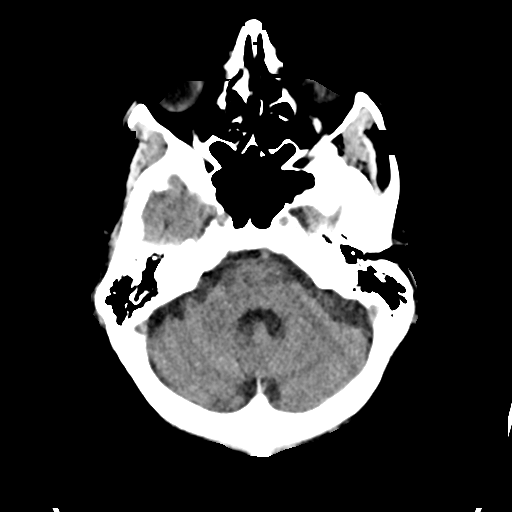
[im 13/35  brain]
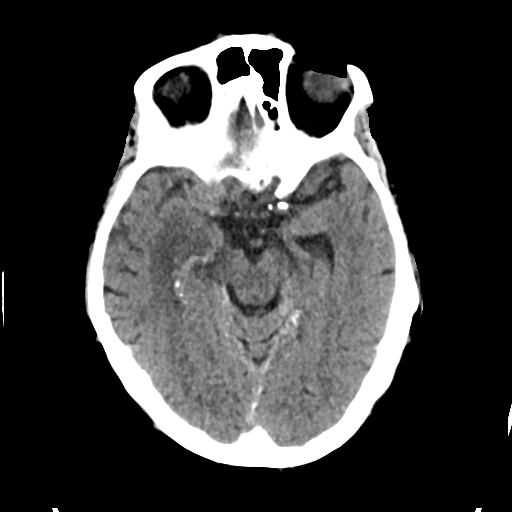
[im 18/35  brain]
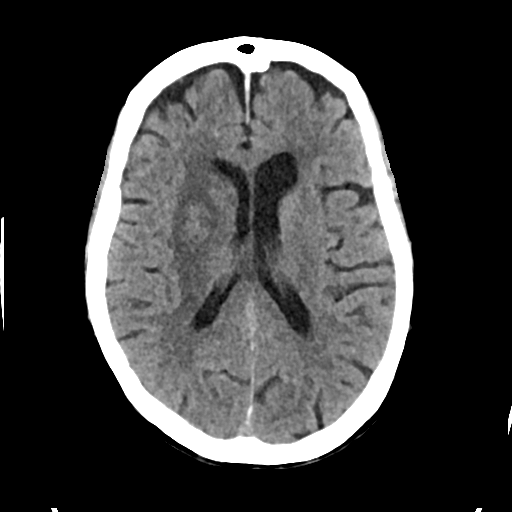
[im 22/35  brain]
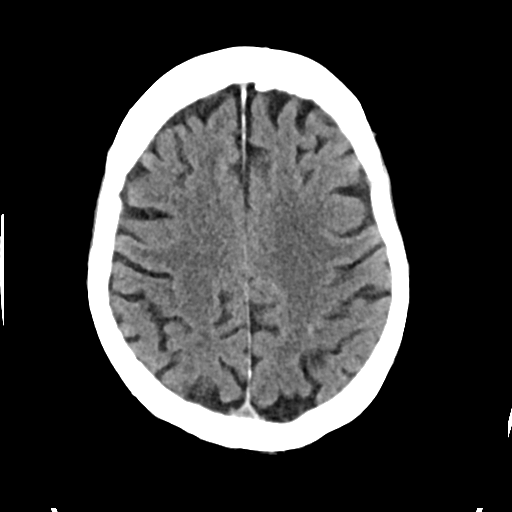
[im 22/35  bone]
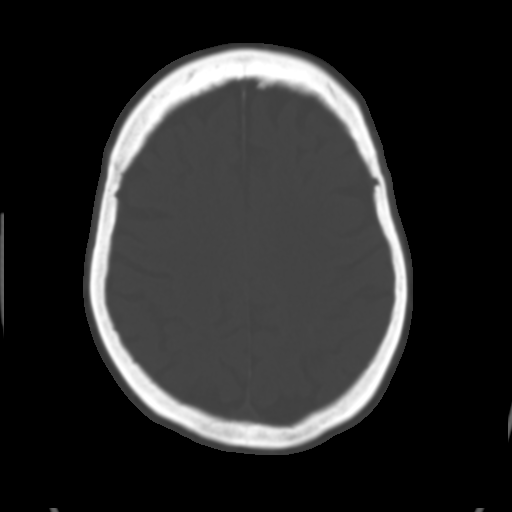
[im 26/35  brain]
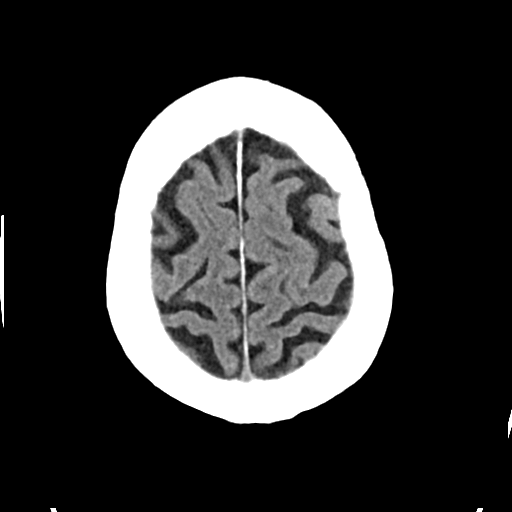
[im 30/35  brain]
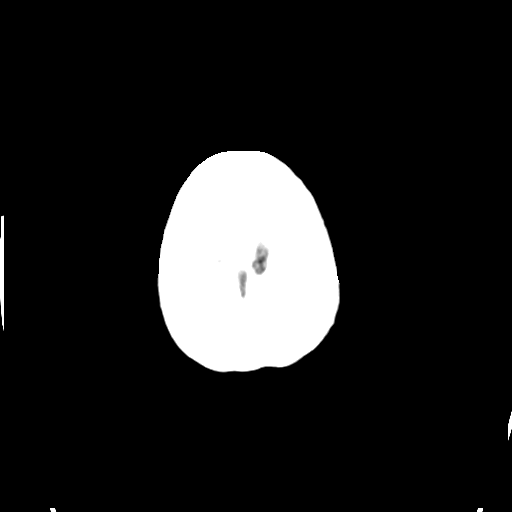

[Series 4: head bone · axial · 0.42mm/px · z∈[-103,-43]mm · 4 of 86 slices shown]
[im 9/86  bone]
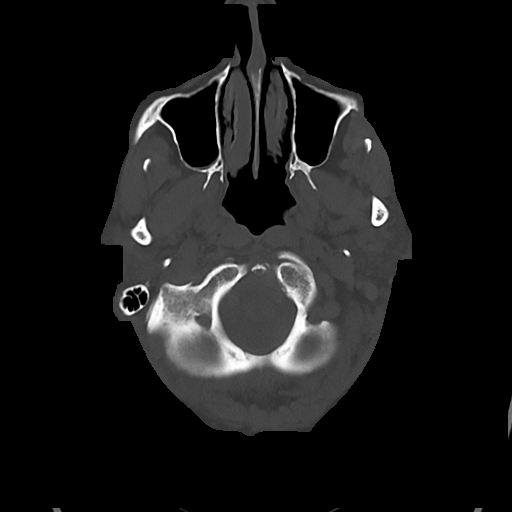
[im 18/86  bone]
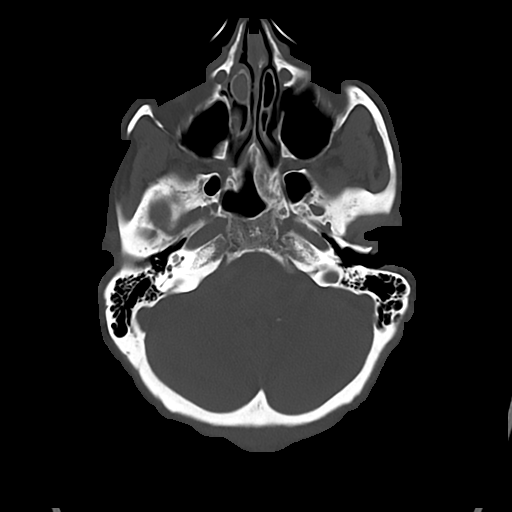
[im 26/86  bone]
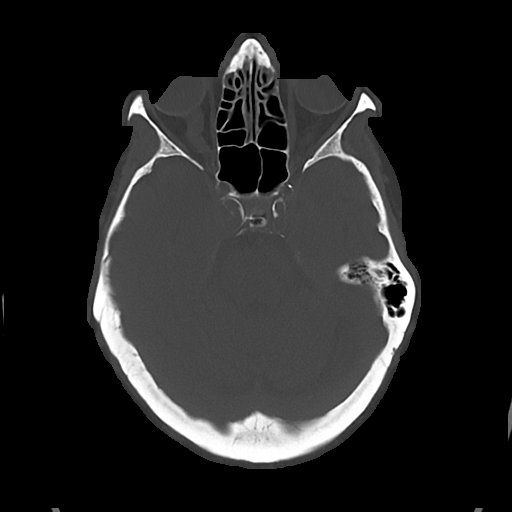
[im 39/86  bone]
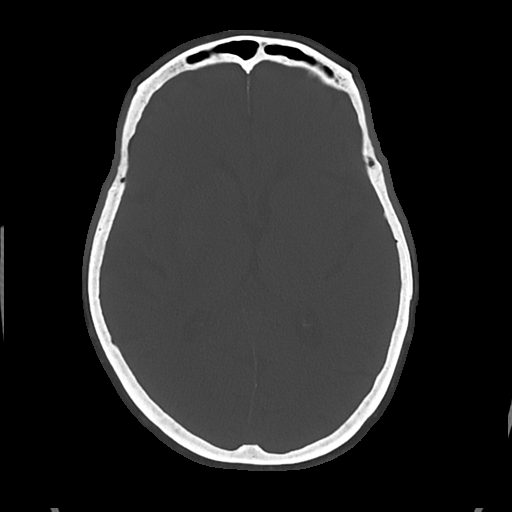

[Series 5: cor soft · coronal · 0.34mm/px · 3 of 69 slices shown]
[im 23/69  brain]
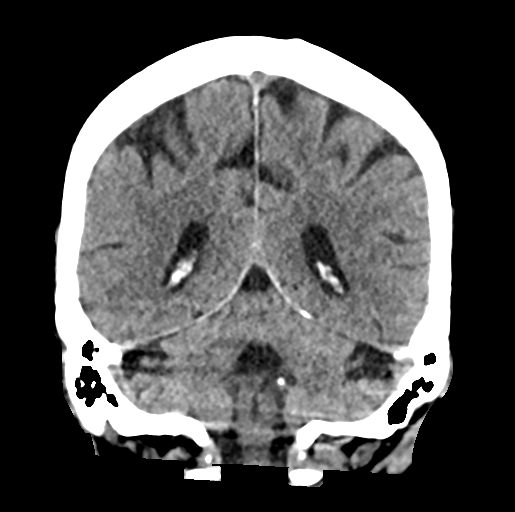
[im 31/69  brain]
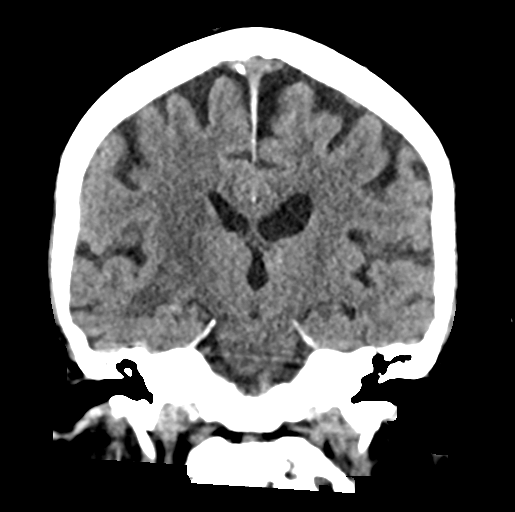
[im 38/69  brain]
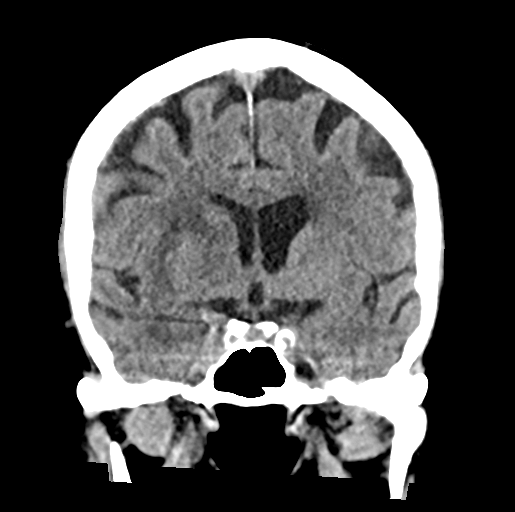

[Series 6: sag soft · sagittal · 0.33mm/px · 3 of 56 slices shown]
[im 19/56  brain]
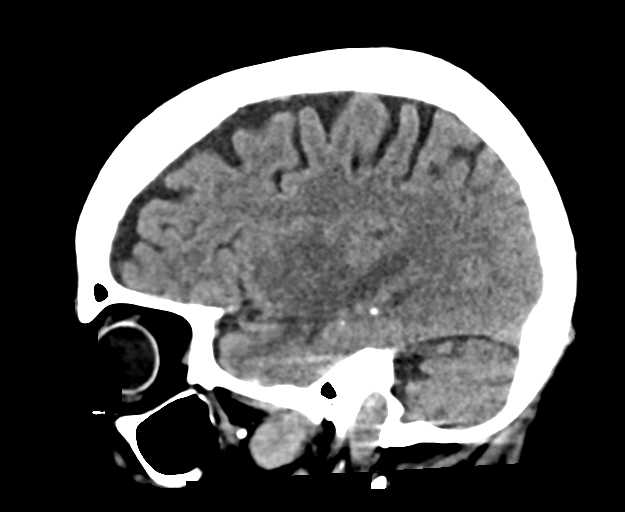
[im 28/56  brain]
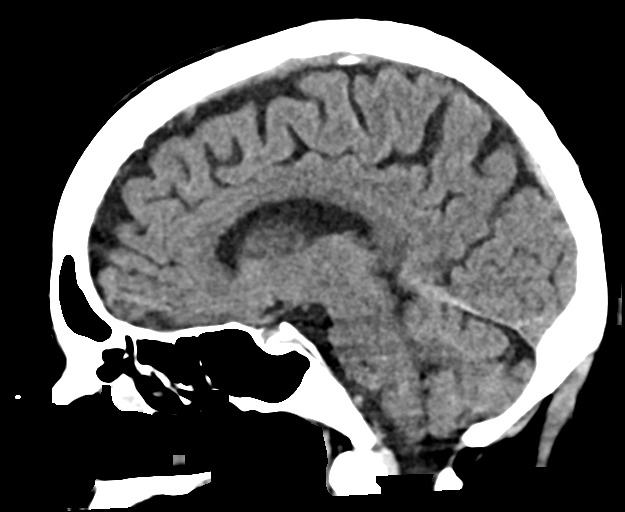
[im 37/56  brain]
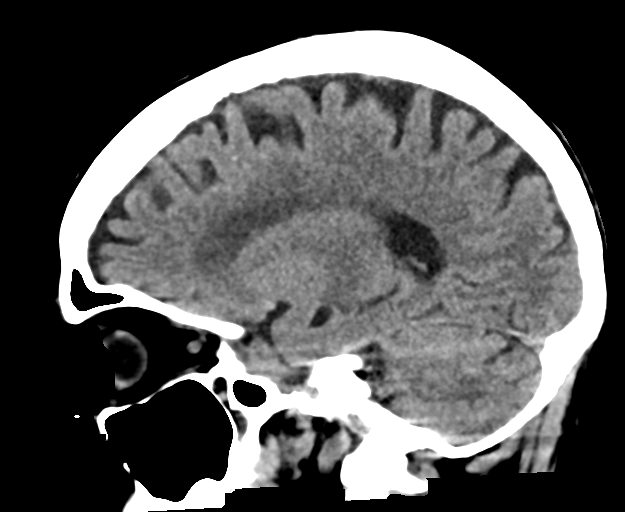

[17 of 47 positions shown; findings below may reference images not displayed]

FINDINGS: Brain: Cytotoxic edema in the right basal ganglia and unusually
involving the medial right temporal, attributed to stroke by prior
workup. Stable petechial hemorrhage in the right putamen. Decreasing
hemorrhage in the right caudate and temporal lobe. No new hemorrhage
or progressive low-density. There is mild local mass effect without
midline shift, effacement of the right lateral ventricle improved.

Vascular: Atherosclerotic calcification

Skull: Negative

Sinuses/Orbits: Unremarkable
IMPRESSION: Decreasing petechial hemorrhage and swelling.  No new abnormality.
